# Patient Record
Sex: Female | Born: 1980 | Race: Black or African American | Hispanic: No | Marital: Single | Smoking: Current every day smoker
Health system: Southern US, Community
[De-identification: ages and names within clinical notes are randomized; demographics above are authoritative.]

## PROBLEM LIST (undated history)

## (undated) DIAGNOSIS — N840 Polyp of corpus uteri: Secondary | ICD-10-CM

---

## 2000-04-11 DIAGNOSIS — N840 Polyp of corpus uteri: Secondary | ICD-10-CM

## 2000-04-11 HISTORY — DX: Polyp of corpus uteri: N84.0

## 2000-04-11 HISTORY — PX: PELVIC LAPAROSCOPY: SHX162

## 2002-04-01 ENCOUNTER — Emergency Department (HOSPITAL_COMMUNITY): Admission: EM | Admit: 2002-04-01 | Discharge: 2002-04-01 | Payer: Self-pay

## 2002-07-10 ENCOUNTER — Emergency Department (HOSPITAL_COMMUNITY): Admission: EM | Admit: 2002-07-10 | Discharge: 2002-07-10 | Payer: Self-pay | Admitting: Podiatry

## 2002-11-18 ENCOUNTER — Inpatient Hospital Stay (HOSPITAL_COMMUNITY): Admission: AD | Admit: 2002-11-18 | Discharge: 2002-11-23 | Payer: Self-pay | Admitting: Obstetrics and Gynecology

## 2003-07-03 ENCOUNTER — Emergency Department (HOSPITAL_COMMUNITY): Admission: EM | Admit: 2003-07-03 | Discharge: 2003-07-03 | Payer: Self-pay | Admitting: Emergency Medicine

## 2003-08-07 ENCOUNTER — Emergency Department (HOSPITAL_COMMUNITY): Admission: EM | Admit: 2003-08-07 | Discharge: 2003-08-07 | Payer: Self-pay | Admitting: Emergency Medicine

## 2003-09-18 ENCOUNTER — Ambulatory Visit (HOSPITAL_COMMUNITY): Admission: RE | Admit: 2003-09-18 | Discharge: 2003-09-18 | Payer: Self-pay | Admitting: Obstetrics

## 2003-12-12 ENCOUNTER — Ambulatory Visit (HOSPITAL_COMMUNITY): Admission: RE | Admit: 2003-12-12 | Discharge: 2003-12-12 | Payer: Self-pay | Admitting: Registered Nurse

## 2004-02-09 ENCOUNTER — Inpatient Hospital Stay (HOSPITAL_COMMUNITY): Admission: AD | Admit: 2004-02-09 | Discharge: 2004-02-11 | Payer: Self-pay | Admitting: Obstetrics

## 2004-08-27 ENCOUNTER — Emergency Department (HOSPITAL_COMMUNITY): Admission: EM | Admit: 2004-08-27 | Discharge: 2004-08-27 | Payer: Self-pay | Admitting: Emergency Medicine

## 2005-09-12 ENCOUNTER — Emergency Department (HOSPITAL_COMMUNITY): Admission: EM | Admit: 2005-09-12 | Discharge: 2005-09-12 | Payer: Self-pay | Admitting: Emergency Medicine

## 2005-12-05 ENCOUNTER — Inpatient Hospital Stay (HOSPITAL_COMMUNITY): Admission: AD | Admit: 2005-12-05 | Discharge: 2005-12-05 | Payer: Self-pay | Admitting: Obstetrics

## 2006-01-27 ENCOUNTER — Ambulatory Visit (HOSPITAL_COMMUNITY): Admission: RE | Admit: 2006-01-27 | Discharge: 2006-01-27 | Payer: Self-pay | Admitting: Obstetrics and Gynecology

## 2006-04-11 HISTORY — PX: TUBAL LIGATION: SHX77

## 2006-04-27 ENCOUNTER — Inpatient Hospital Stay (HOSPITAL_COMMUNITY): Admission: AD | Admit: 2006-04-27 | Discharge: 2006-04-27 | Payer: Self-pay | Admitting: Obstetrics

## 2006-06-08 ENCOUNTER — Observation Stay (HOSPITAL_COMMUNITY): Admission: AD | Admit: 2006-06-08 | Discharge: 2006-06-08 | Payer: Self-pay | Admitting: Obstetrics

## 2006-06-10 ENCOUNTER — Inpatient Hospital Stay (HOSPITAL_COMMUNITY): Admission: AD | Admit: 2006-06-10 | Discharge: 2006-06-13 | Payer: Self-pay | Admitting: Obstetrics

## 2011-04-18 ENCOUNTER — Emergency Department (HOSPITAL_COMMUNITY)
Admission: EM | Admit: 2011-04-18 | Discharge: 2011-04-18 | Disposition: A | Discharge status: Home / Self Care | Payer: Self-pay | Attending: Emergency Medicine | Admitting: Emergency Medicine

## 2011-04-18 ENCOUNTER — Encounter: Payer: Self-pay | Admitting: Emergency Medicine

## 2011-04-18 DIAGNOSIS — L02419 Cutaneous abscess of limb, unspecified: Secondary | ICD-10-CM | POA: Insufficient documentation

## 2011-04-18 DIAGNOSIS — L03119 Cellulitis of unspecified part of limb: Secondary | ICD-10-CM | POA: Insufficient documentation

## 2011-04-18 DIAGNOSIS — L0390 Cellulitis, unspecified: Secondary | ICD-10-CM

## 2011-04-18 DIAGNOSIS — F17200 Nicotine dependence, unspecified, uncomplicated: Secondary | ICD-10-CM | POA: Insufficient documentation

## 2011-04-18 DIAGNOSIS — M79609 Pain in unspecified limb: Secondary | ICD-10-CM | POA: Insufficient documentation

## 2011-04-18 MED ORDER — DOXYCYCLINE HYCLATE 100 MG PO CAPS: 100.0000 mg | ORAL_CAPSULE | Freq: Two times a day (BID) | ORAL | Status: AC

## 2011-04-18 MED ORDER — IBUPROFEN 800 MG PO TABS: 800.0000 mg | ORAL_TABLET | Freq: Three times a day (TID) | ORAL | Status: AC

## 2011-04-18 MED ORDER — IBUPROFEN 800 MG PO TABS
800.0000 mg | ORAL_TABLET | Freq: Once | ORAL | Status: AC
  Administered 2011-04-18: 800 mg via ORAL
  Filled 2011-04-18: qty 1

## 2011-04-18 MED ORDER — ACETAMINOPHEN-CODEINE #3 300-30 MG PO TABS: 1.0000 | ORAL_TABLET | Freq: Four times a day (QID) | ORAL | Status: AC | PRN

## 2011-04-18 NOTE — ED Provider Notes (Signed)
History     CSN: [id number]  Arrival date & time 04/18/11  1050   None     Chief Complaint  Patient presents with  . Recurrent Skin Infections    red rasised area in r/groin-thigh     (Consider location/radiation/quality/duration/timing/severity/associated sxs/prior treatment) HPI  Patient presents to ER complaining of "boil" to right upper inner thigh stating she has been having intermittent, recurrent "infections" on inner thighs for the last few weeks stating that she will notice "bumps come up and then go away" but states the current "bump" x 3 days with mild TTP. Denies denies drainage, fevers, chills, pain with walking. Patient states she has no known medical problems and takes no meds on regular basis. Denies aggravating or alleviating factors.   History reviewed. No pertinent past medical history.  Past Surgical History  Procedure Date  . Cesarean section     Family History  Problem Relation Age of Onset  . Hypertension Mother     History  Substance Use Topics  . Smoking status: Current Everyday Smoker  . Smokeless tobacco: Not on file  . Alcohol Use: No    OB History    Grav Para Term Preterm Abortions TAB SAB Ect Mult Living                  Review of Systems  All other systems reviewed and are negative.    Allergies  Review of patient's allergies indicates no known allergies.  Home Medications   Current Outpatient Rx  Name Route Sig Dispense Refill  . ACETAMINOPHEN 500 MG PO TABS Oral Take 1,000 mg by mouth every 6 (six) hours as needed. For headache       BP 112/63  Pulse 80  Temp(Src) 98.3 F (36.8 C) (Oral)  Resp 16  SpO2 100%  LMP 04/11/2011  Physical Exam  Nursing note and vitals reviewed. Constitutional: She is oriented to person, place, and time. She appears well-developed and well-nourished. No distress.  HENT:  Head: Normocephalic and atraumatic.  Eyes: Conjunctivae and EOM are normal. Pupils are equal, round, and reactive to  light.  Neck: Normal range of motion. Neck supple.  Cardiovascular: Normal rate, regular rhythm, normal heart sounds and intact distal pulses.  Exam reveals no gallop and no friction rub.   No murmur heard. Pulmonary/Chest: Effort normal and breath sounds normal. No respiratory distress. She has no wheezes. She has no rales. She exhibits no tenderness.  Abdominal: Bowel sounds are normal. She exhibits no distension and no mass. There is no tenderness. There is no rebound and no guarding.  Musculoskeletal: Normal range of motion. She exhibits no edema and no tenderness.  Neurological: She is alert and oriented to person, place, and time.  Skin: Skin is warm and dry. No rash noted. She is not diaphoretic. No erythema.       Dime size papule of right upper inner thigh with mild TTP and faint erythema but no crepitous or fluctuance.   Psychiatric: She has a normal mood and affect.    ED Course  Procedures (including critical care time)  Labs Reviewed - No data to display No results found.   1. Cellulitis       MDM  Small area of induration without fluctuance that patient declines I&D after discussion of treatment options. I believe abx and warm compresses is appropriate treatment course at this time for small area of cellulitis. Afebrile.         Jenness Corner, PA  04/18/11 1531 

## 2011-04-18 NOTE — ED Provider Notes (Signed)
Medical screening examination/treatment/procedure(s) were performed by non-physician practitioner and as supervising physician I was immediately available for consultation/collaboration.  Orpha Dain, MD 04/18/11 1615 

## 2014-06-15 ENCOUNTER — Emergency Department (HOSPITAL_BASED_OUTPATIENT_CLINIC_OR_DEPARTMENT_OTHER)
Admission: EM | Admit: 2014-06-15 | Discharge: 2014-06-15 | Disposition: A | Discharge status: Home / Self Care | Payer: Self-pay | Attending: Emergency Medicine | Admitting: Emergency Medicine

## 2014-06-15 ENCOUNTER — Encounter (HOSPITAL_BASED_OUTPATIENT_CLINIC_OR_DEPARTMENT_OTHER): Payer: Self-pay | Admitting: *Deleted

## 2014-06-15 DIAGNOSIS — K029 Dental caries, unspecified: Secondary | ICD-10-CM | POA: Insufficient documentation

## 2014-06-15 DIAGNOSIS — Z720 Tobacco use: Secondary | ICD-10-CM | POA: Insufficient documentation

## 2014-06-15 DIAGNOSIS — K088 Other specified disorders of teeth and supporting structures: Secondary | ICD-10-CM | POA: Insufficient documentation

## 2014-06-15 DIAGNOSIS — K0889 Other specified disorders of teeth and supporting structures: Secondary | ICD-10-CM

## 2014-06-15 MED ORDER — HYDROCODONE-ACETAMINOPHEN 5-325 MG PO TABS: 1.0000 | ORAL_TABLET | Freq: Four times a day (QID) | ORAL | Status: DC | PRN

## 2014-06-15 MED ORDER — PENICILLIN V POTASSIUM 500 MG PO TABS: 500.0000 mg | ORAL_TABLET | Freq: Three times a day (TID) | ORAL | Status: DC

## 2014-06-15 NOTE — ED Notes (Signed)
MD at bedside. 

## 2014-06-15 NOTE — ED Notes (Signed)
Pt driving, with 2 children.

## 2014-06-15 NOTE — Discharge Instructions (Signed)
Penicillin as prescribed.  Hydrocodone as prescribed as needed for pain.  Follow-up with dentistry in the next 2-3 days for a recheck.   Dental Pain A tooth ache may be caused by cavities (tooth decay). Cavities expose the nerve of the tooth to air and hot or cold temperatures. It may come from an infection or abscess (also called a boil or furuncle) around your tooth. It is also often caused by dental caries (tooth decay). This causes the pain you are having. DIAGNOSIS  Your caregiver can diagnose this problem by exam. TREATMENT   If caused by an infection, it may be treated with medications which kill germs (antibiotics) and pain medications as prescribed by your caregiver. Take medications as directed.  Only take over-the-counter or prescription medicines for pain, discomfort, or fever as directed by your caregiver.  Whether the tooth ache today is caused by infection or dental disease, you should see your dentist as soon as possible for further care. SEEK MEDICAL CARE IF: The exam and treatment you received today has been provided on an emergency basis only. This is not a substitute for complete medical or dental care. If your problem worsens or new problems (symptoms) appear, and you are unable to meet with your dentist, call or return to this location. SEEK IMMEDIATE MEDICAL CARE IF:   You have a fever.  You develop redness and swelling of your face, jaw, or neck.  You are unable to open your mouth.  You have severe pain uncontrolled by pain medicine. MAKE SURE YOU:   Understand these instructions.  Will watch your condition.  Will get help right away if you are not doing well or get worse. Document Released: 03/28/2005 Document Revised: 06/20/2011 Document Reviewed: 11/14/2007 [facility] Patient Information 2015 [place], [place]. This information is not intended to replace advice given to you by your health care provider. Make sure you discuss any questions you have with your  health care provider.    Emergency Department Resource Guide 1) Find a Doctor and Pay Out of Pocket Although you won't have to find out who is covered by your insurance plan, it is a good idea to ask around and get recommendations. You will then need to call the office and see if the doctor you have chosen will accept you as a new patient and what types of options they offer for patients who are self-pay. Some doctors offer discounts or will set up payment plans for their patients who do not have insurance, but you will need to ask so you aren't surprised when you get to your appointment.  2) Contact Your Local Health Department Not all health departments have doctors that can see patients for sick visits, but many do, so it is worth a call to see if yours does. If you don't know where your local health department is, you can check in your phone book. The CDC also has a tool to help you locate your state's health department, and many state websites also have listings of all of their local health departments.  3) Find a Walk-in Clinic If your illness is not likely to be very severe or complicated, you may want to try a walk in clinic. These are popping up all over the country in pharmacies, drugstores, and shopping centers. They're usually staffed by nurse practitioners or physician assistants that have been trained to treat common illnesses and complaints. They're usually fairly quick and inexpensive. However, if you have serious medical issues or chronic medical problems,  these are probably not your best option.  No Primary Care Doctor: - Call Health Connect at  [phone] - they can help you locate a primary care doctor that  accepts your insurance, provides certain services, etc. - Physician Referral Service- [phone]  Chronic Pain Problems: Organization         Address  Phone   Notes  [facility]  [phone] Patients need to be referred by their primary care  doctor.   Medication Assistance: Organization         Address  Phone   Notes  [facility] Medication [facility] [address] [place], [place] [address] [phone] --Must be a resident of [facility] -- Must have NO insurance coverage whatsoever (no Medicaid/ Medicare, etc.) -- The pt. MUST have a primary care doctor that directs their care regularly and follows them in the community   [facility]  [phone]   [employer]  [phone]    Agencies that provide inexpensive medical care: Organization         Address  Phone   Notes  [facility]  [phone]   [facility]    [phone]   [facility] [address] [place], [place] [address] [phone]   [facility] [address] [place] [place] [phone]   Planned Parenthood    [phone]   [facility]    [phone]   [facility]  [address], [place] Phone:  [phone], Fax:  [phone] Hours of Operation:  9 am - 6 pm, M-F.  Also accepts Medicaid/Medicare and self-pay.  [facility]  [address], [place] Phone: [phone], Fax: [phone]. Hours of Operation:  8:30 am - 5:30 pm, M-F.  Also accepts Medicaid and self-pay.  [facility] [place] [address] [place] Phone: [phone]   [facility] [address] Natasha Bence [place], [place] [phone], Ext. [phone] Mondays & Thursdays: 7-9 AM.  First 15 patients are seen on a first come, first serve basis.    Medicaid-accepting [facility] Providers:  Organization         Address  Phone   Notes  [facility] [address], [place] [phone] Also accepts self-pay patients.  [facility] [address] Laurell Josephs [place], [place]  [phone]   [facility] [address], [place] [phone]   [facility] [address], [place] [phone]   Renaye Rakers [address], [place]   [phone] Only accepts [place] Access [place] patients after they have their name applied to their card.   Self-Pay (no insurance) in [facility]:  Organization         Address  Phone   Notes  Sickle Cell Patients, [facility] Internal Medicine [address] [place], [place] [phone]   [facility] [address] [place], [place] [phone]   [facility]  [address],  [phone]   [facility]/Dr. Osei-Bonsu  [address] [place] or [address], [place] [phone] Phone number for both [place] and [place] locations is the same.  Urgent Medical and  [facility] [address] [place] [phone]   [facility] [address] [place] or [address] [phone]   [facility] [address], [place] [phone], phone; [phone], fax Sees patients 1st and 3rd Saturday of every month.  Must not qualify for public or private insurance (i.e. Medicaid, Medicare, [place] Health Choice, Veterans' Benefits)  Household income should be no more than 200% of the poverty level The clinic cannot treat you if you are pregnant or think you are pregnant  Sexually transmitted diseases are not treated at the clinic.    Dental Care: Organization         Address  Phone  Notes  [facility] Department of [facility] [address] [place], [place] [phone] Accepts children up to age 79 who are enrolled in [place] or [place] Health Choice; pregnant women with a Medicaid card; and children who have applied for Medicaid or [place] Health Choice, but were declined, whose parents can pay a reduced fee at time of  service.  [facility] Department of [facility]  [address], [place] [phone] Accepts children up to age 58 who are enrolled in [place] or [place] Health Choice; pregnant women with a Medicaid card; and children who have applied for Medicaid or [place] Health Choice, but were declined, whose parents can pay a reduced fee at time of service.  [facility]  [address] [place], [place] [phone] Patients are seen by appointment only. Walk-ins are not accepted. [facility] will see patients 15 years of age and older. Monday - Tuesday (8am-5pm) Most Wednesdays (8:30-5pm) $30 per visit, cash only  [facility] Adult Dental Access PRO[facility]  [address], [facility] [phone] Patients are seen by appointment only. Walk-ins are not accepted. [facility] will see patients 48 years of age and older. One Wednesday Evening (Monthly: Volunteer Based).  $30 per visit, cash only  [employer]  [phone] for adults; Children under age 61, call Graduate Pediatric Dentistry at [phone]. Children aged 21-14, please call [phone] to request a pediatric application.  Dental services are provided in all areas of dental care including fillings, crowns and bridges, complete and partial dentures, implants, gum treatment, root canals, and extractions. Preventive care is also provided. Treatment is provided to both adults and children. Patients are selected via a lottery and there is often a waiting list.   [facility] [address] [place]  [phone] [email or web address]   [facility] [address] [place], [place] [phone], Ext. [phone] Second and Fourth Thursday of each month, opens at 6:30 AM; Clinic ends at 9 AM.  Patients are seen on a first-come first-served basis, and a limited number are seen during each clinic.   [facility]  [address] Ether Griffins [place],  [place] [phone]   Eligibility Requirements You must have lived in [place], [place], or [place] counties for at least the last three months.   You cannot be eligible for state or federal sponsored [place], including [employer], [place], or [employer].   You generally cannot be eligible for healthcare insurance through your employer.    How to apply: Eligibility screenings are held every Tuesday and Wednesday afternoon from 1:00 pm until 4:00 pm. You do not need an appointment for the interview!  [facility]  Dental Clinic [address], [place], [place] [phone]   [facility] Health Department  [phone]   [facility] Health Department  [phone]   [facility] Health Department  [phone]    Behavioral Health Resources in the Community: Intensive Outpatient Programs Organization         Address  Phone  Notes  [facility] Services [address] [place], [place] [phone]   [facility] Outpatient [address], [place], [place] [phone]   [facility] [address] [place], [place]  [phone]   [facility] Mental Health [address],  [place], [place] [phone] or [phone]   Substance Abuse Resources Organization         Address  Phone  Notes  [facility] Drug Services  [phone]   [facility]  [phone]   [facility] [place]  [phone]   Floydene Flock  [phone]   Residential & Outpatient Substance Abuse Program  [phone]   Psychological Services Organization         Address  Phone  Notes  [facility] Health  [phone]   [facility] Services  [phone]   [facility] Mental Health [address], [place] [phone] or [phone]    Mobile Crisis Teams Organization         Address  Phone  Notes  [facility], Mobile Crisis Care Unit  [phone]   [facility]  [address] [place], [place] [phone]   Doristine Locks [address] [place] [phone]    Self-Help/Support Groups Organization         Address  Phone             Notes  [facility] [place] - variety of support groups  336- I7437963 Call for more information  Narcotics Anonymous (NA), [facility] [address], [employer] [place]  2 meetings at this location   Statistician         Address  Phone  Notes  [facility] [address],    [place]  [phone]   [facility]  [address] [place] [address], [place], [place] [phone]   [facility] [address] [place], [place] [phone] Admissions: 8am-3pm M-F  [facility] [address],    [place], [place] [phone]   [facility] [address] [place], [place], [place] [phone]   [facility] [address]  [place], [place] [phone]   [facility] - Intensive Outpatient [address] Laurell Josephs [address], [place], [place] [phone]   [facility] ([facility]iction [facility] [address] [place]  [place], [place] [phone] or 5742847411   [facility] ([facility]) [address], [place], [place] [phone] Accepts Medicaid  [facility] [address]  [place] [phone] Substance Abuse/Addiction Treatment   [facility] Organization         Address  Phone  Notes  [facility]  [phone]   Angie Fava, PhD [address] Ervin Knack [place], [place]   [phone] or [phone]   [facility] Behavioral   [address] [place], [place] [phone]   [facility] [address] [place], [place] [phone] Insurance/Medicaid/sponsorship through [employer] and Families [address]  [place], [place] [phone]  [place], [place] [phone]    Dr. Adele Schilder  [phone]   [facility] Dept. 1) [address], [place] 2) [place] 3)  [place] [address], [place] [phone]  [phone]   [place] [phone] or [phone] (After Hours)

## 2014-06-15 NOTE — ED Notes (Signed)
Pt reports 3 days of pain and swelling on right side of face, unsure if from teeth on right upper or lower.  No obvious cavities or chippings noted.  Mild swelling to right side of face with tenderness to jaw, upper aspect of neck back to lobe of ear and sinus area.  Denies fever.

## 2014-06-15 NOTE — ED Notes (Signed)
Patient states she is having right side dental and facial pain and swelling since friday

## 2014-06-16 NOTE — ED Provider Notes (Signed)
CSN: [id number]     Arrival date & time 06/15/14  1440 History   First MD Initiated Contact with Patient 06/15/14 1508     Chief Complaint  Patient presents with  . Dental Pain     (Consider location/radiation/quality/duration/timing/severity/associated sxs/prior Treatment) HPI Comments: Patient presents with pain and swelling to the right jaw and side of face. She denies any fevers or chills. She denies any difficulty swallowing.  Patient is a 34 y.o. female presenting with tooth pain. The history is provided by the patient.  Dental Pain Location:  Lower Lower teeth location:  30/RL 1st molar and 31/RL 2nd molar Quality:  Throbbing Severity:  Moderate Onset quality:  Gradual Duration:  2 days Timing:  Constant Progression:  Worsening Chronicity:  New Relieved by:  Nothing Worsened by:  Nothing tried   History reviewed. No pertinent past medical history. Past Surgical History  Procedure Laterality Date  . Cesarean section     Family History  Problem Relation Age of Onset  . Hypertension Mother    History  Substance Use Topics  . Smoking status: Current Every Day Smoker  . Smokeless tobacco: Not on file  . Alcohol Use: Yes     Comment: occas   OB History    No data available     Review of Systems  All other systems reviewed and are negative.     Allergies  Review of patient's allergies indicates no known allergies.  Home Medications   Prior to Admission medications   Medication Sig Start Date End Date Taking? Authorizing Provider  acetaminophen (TYLENOL) 500 MG tablet Take 1,000 mg by mouth every 6 (six) hours as needed. For headache     Historical Provider, MD  HYDROcodone-acetaminophen (NORCO) 5-325 MG per tablet Take 1-2 tablets by mouth every 6 (six) hours as needed. 06/15/14   Geoffery Lyonsouglas Minela [place], MD  penicillin v potassium (VEETID) 500 MG tablet Take 1 tablet (500 mg total) by mouth 3 (three) times daily. 06/15/14   Geoffery Lyonsouglas Marjon Doxtater, MD   BP 136/86 mmHg  Pulse 85   Temp(Src) 98.7 F (37.1 C) (Oral)  Resp 16  Ht 4\' 11"  (1.499 m)  Wt 134 lb (60.782 kg)  BMI 27.05 kg/m2  SpO2 100% Physical Exam  Constitutional: She is oriented to person, place, and time. She appears well-developed and well-nourished. No distress.  HENT:  Head: Normocephalic and atraumatic.  There is swelling and tenderness to palpation to the right lower jaw. There are several teeth with multiple caries, however no swelling that would be indicative of an abscess. There is some gingival inflammation surrounding the lower rear molars.  Neck: Normal range of motion. Neck supple.  Neurological: She is alert and oriented to person, place, and time.  Skin: Skin is warm and dry. She is not diaphoretic.  Nursing note and vitals reviewed.   ED Course  Procedures (including critical care time) Labs Review Labs Reviewed - No data to display  Imaging Review No results found.   EKG Interpretation None      MDM   Final diagnoses:  Pain, dental    We'll treat with antibiotics, pain medication, and follow-up with dentistry.    Geoffery Lyonsouglas Karmela Bram, MD 06/16/14 [phone]

## 2014-09-28 ENCOUNTER — Emergency Department (HOSPITAL_BASED_OUTPATIENT_CLINIC_OR_DEPARTMENT_OTHER)
Admission: EM | Admit: 2014-09-28 | Discharge: 2014-09-28 | Disposition: A | Discharge status: Home / Self Care | Payer: Self-pay | Attending: Emergency Medicine | Admitting: Emergency Medicine

## 2014-09-28 ENCOUNTER — Encounter (HOSPITAL_BASED_OUTPATIENT_CLINIC_OR_DEPARTMENT_OTHER): Payer: Self-pay | Admitting: *Deleted

## 2014-09-28 DIAGNOSIS — S025XXB Fracture of tooth (traumatic), initial encounter for open fracture: Secondary | ICD-10-CM | POA: Insufficient documentation

## 2014-09-28 DIAGNOSIS — Z720 Tobacco use: Secondary | ICD-10-CM | POA: Insufficient documentation

## 2014-09-28 DIAGNOSIS — Y998 Other external cause status: Secondary | ICD-10-CM | POA: Insufficient documentation

## 2014-09-28 DIAGNOSIS — Y9289 Other specified places as the place of occurrence of the external cause: Secondary | ICD-10-CM | POA: Insufficient documentation

## 2014-09-28 DIAGNOSIS — Y9389 Activity, other specified: Secondary | ICD-10-CM | POA: Insufficient documentation

## 2014-09-28 DIAGNOSIS — X58XXXA Exposure to other specified factors, initial encounter: Secondary | ICD-10-CM | POA: Insufficient documentation

## 2014-09-28 MED ORDER — PENICILLIN V POTASSIUM 500 MG PO TABS: 500.0000 mg | ORAL_TABLET | Freq: Three times a day (TID) | ORAL | Status: DC

## 2014-09-28 MED ORDER — OXYCODONE-ACETAMINOPHEN 5-325 MG PO TABS
2.0000 | ORAL_TABLET | Freq: Once | ORAL | Status: AC
  Administered 2014-09-28: 2 via ORAL
  Filled 2014-09-28: qty 2

## 2014-09-28 MED ORDER — PENICILLIN V POTASSIUM 250 MG PO TABS
500.0000 mg | ORAL_TABLET | Freq: Once | ORAL | Status: AC
  Administered 2014-09-28: 500 mg via ORAL
  Filled 2014-09-28: qty 2

## 2014-09-28 MED ORDER — OXYCODONE-ACETAMINOPHEN 5-325 MG PO TABS: 1.0000 | ORAL_TABLET | ORAL | Status: DC | PRN

## 2014-09-28 NOTE — ED Notes (Signed)
Patient c/o dental pain due to cracking a tooth on Friday on her L lower jaw

## 2014-09-28 NOTE — ED Provider Notes (Signed)
CSN: [id number]     Arrival date & time 09/28/14  0740 History   First MD Initiated Contact with Patient 09/28/14 0802     Chief Complaint  Patient presents with  . Dental Pain     (Consider location/radiation/quality/duration/timing/severity/associated sxs/prior Treatment) Patient is a 34 y.o. female presenting with tooth pain. The history is provided by the patient.  Dental Pain Location:  Lower Lower teeth location:  18/LL 2nd molar Quality:  Aching, sharp and constant Severity:  Moderate Onset quality:  Sudden Timing:  Constant Progression:  Unchanged Context: dental fracture   Previous work-up:  Root canal Relieved by:  Acetaminophen Worsened by:  Nothing tried Ineffective treatments:  None tried Associated symptoms: no congestion, no difficulty swallowing, no drooling, no facial pain, no facial swelling, no fever, no gum swelling, no headaches, no neck pain, no neck swelling, no oral bleeding, no oral lesions and no trismus   Risk factors: lack of dental care   Risk factors: no alcohol problem, no cancer, no chewing tobacco use, no diabetes and no immunosuppression     History reviewed. No pertinent past medical history. Past Surgical History  Procedure Laterality Date  . Cesarean section     Family History  Problem Relation Age of Onset  . Hypertension Mother    History  Substance Use Topics  . Smoking status: Current Every Day Smoker  . Smokeless tobacco: Not on file  . Alcohol Use: Yes     Comment: occas   OB History    No data available     Review of Systems  Constitutional: Negative for fever.  HENT: Negative for congestion, drooling, facial swelling and mouth sores.   Musculoskeletal: Negative for neck pain.  Neurological: Negative for headaches.  All other systems reviewed and are negative.     Allergies  Review of patient's allergies indicates no known allergies.  Home Medications   Prior to Admission medications   Medication Sig Start  Date End Date Taking? Authorizing Provider  acetaminophen (TYLENOL) 500 MG tablet Take 1,000 mg by mouth every 6 (six) hours as needed. For headache     Historical Provider, MD  HYDROcodone-acetaminophen (NORCO) 5-325 MG per tablet Take 1-2 tablets by mouth every 6 (six) hours as needed. 06/15/14   Geoffery Lyons, MD  oxyCODONE-acetaminophen (PERCOCET/ROXICET) 5-325 MG per tablet Take 1 tablet by mouth every 4 (four) hours as needed. 09/28/14   Margarita Grizzle, MD  penicillin v potassium (VEETID) 500 MG tablet Take 1 tablet (500 mg total) by mouth 3 (three) times daily. 09/28/14   Margarita Grizzle, MD   BP 119/59 mmHg  Pulse 85  Temp(Src) 98.5 F (36.9 C) (Oral)  Resp 18  Ht 4\' 11"  (1.499 m)  Wt 125 lb (56.7 kg)  BMI 25.23 kg/m2  SpO2 100% Physical Exam  Constitutional: She appears well-developed and well-nourished.  HENT:  Head: Normocephalic and atraumatic.  Mouth/Throat: Uvula is midline and oropharynx is clear and moist. No posterior oropharyngeal edema or posterior oropharyngeal erythema.    fracture  Eyes: Pupils are equal, round, and reactive to light.  Neck: Normal range of motion. Neck supple.  Cardiovascular: Normal rate.   Pulmonary/Chest: Effort normal.  Abdominal: Soft.  Musculoskeletal: Normal range of motion.  Neurological: She is alert.  Skin: Skin is warm and dry.  Psychiatric: She has a normal mood and affect.  Nursing note and vitals reviewed.   ED Course  Procedures (including critical care time) Labs Review Labs Reviewed - No data to display  Imaging Review No results found.   EKG Interpretation None      MDM   Final diagnoses:  Tooth fracture, open, initial encounter        Margarita Grizzle, MD 09/28/14 [phone]

## 2014-09-28 NOTE — Discharge Instructions (Signed)
Dental Fracture °You have a dental fracture or injury. This can mean the tooth is loose, has a chip in the enamel or is broken. If just the outer enamel is chipped, there is a good chance the tooth will not become infected. The only treatment needed may be to smooth off a rough edge. Fractures into the deeper layers (dentin and pulp) cause greater pain and are more likely to become infected. These require you to see a dentist as soon as possible to save the tooth. °Loose teeth may need to be wired or bonded with a plastic splint to hold them in place. A paste may be painted on the open area of the broken tooth to reduce the pain. Antibiotics and pain medicine may be prescribed. Choosing a soft or liquid diet and rinsing the mouth out with warm water after meals may be helpful. °See your dentist as recommended. Failure to seek care or follow up with a dentist or other specialist as recommended could result in the loss of your tooth, infection, or permanent dental problems. °SEEK MEDICAL CARE IF:  °· You have increased pain not controlled with medicines. °· You have swelling around the tooth, in the face or neck. °· You have bleeding which starts, continues, or gets worse. °· You have a fever. °Document Released: 05/05/2004 Document Revised: 06/20/2011 Document Reviewed: 02/17/2009 °ExitCare® Patient Information ©2015 [employer]. This information is not intended to replace advice given to you by your health care provider. Make sure you discuss any questions you have with your health care provider. ° ° ° °Emergency Department Resource Guide °1) Find a Doctor and Pay Out of Pocket °Although you won't have to find out who is covered by your insurance plan, it is a good idea to ask around and get recommendations. You will then need to call the office and see if the doctor you have chosen will accept you as a new patient and what types of options they offer for patients who are self-pay. Some doctors offer discounts or  will set up payment plans for their patients who do not have insurance, but you will need to ask so you aren't surprised when you get to your appointment. ° °2) Contact Your Local Health Department °Not all health departments have doctors that can see patients for sick visits, but many do, so it is worth a call to see if yours does. If you don't know where your local health department is, you can check in your phone book. The CDC also has a tool to help you locate your state's health department, and many state websites also have listings of all of their local health departments. ° °3) Find a Walk-in Clinic °If your illness is not likely to be very severe or complicated, you may want to try a walk in clinic. These are popping up all over the country in pharmacies, drugstores, and shopping centers. They're usually staffed by nurse practitioners or physician assistants that have been trained to treat common illnesses and complaints. They're usually fairly quick and inexpensive. However, if you have serious medical issues or chronic medical problems, these are probably not your best option. ° °No Primary Care Doctor: °- Call Health Connect at  832-8000 - they can help you locate a primary care doctor that  accepts your insurance, provides certain services, etc. °- Physician Referral Service- 1-800-533-3463 ° °Chronic Pain Problems: °Organization         Address  Phone   Notes  °[facility]  ([phone]) [phone] Patients need to be referred by their primary care doctor.  ° °Medication Assistance: °Organization         Address  Phone   Notes  °[place] [facility] [address] °[place], [place] [address] (336) 641-8030 --Must be a resident of [place] °-- Must have NO insurance coverage whatsoever (no Medicaid/ Medicare, etc.) °-- The pt. MUST have a primary care doctor that directs their care regularly and follows them in the community °  °[facility]  (866)  331-1348   °[employer] Way  (888) 892-1162   ° °Agencies that provide inexpensive medical care: °Organization         Address  Phone   Notes  °[facility]  (336) 832-8035   °[facility]    (336) 832-7272   °[facility] [address] °[place], [place] [address] (336) 832-4777   °[facility] [address], °[place] (336) 271-4999   °[facility]    (336) 373-0678   °[facility]    (336) 272-1050   °[facility] ° [address], [place] Phone:  (336) 832-4444, Fax:  (336) 832-4440 Hours of Operation:  9 am - 6 pm, M-F.  Also accepts Medicaid/Medicare and self-pay.  °[facility] ° [address], [place] Phone: (336) 832-3150, Fax: (336) [phone]. Hours of Operation:  8:30 am - 5:30 pm, M-F.  Also accepts Medicaid and self-pay.  °[facility] [place] [address], [place] Phone: (336) 878-6027   °[facility] [address], [place], [place] (336)723-1848, Ext. 123 Mondays & Thursdays: 7-9 AM.  First 15 patients are seen on a first come, first serve basis. °  ° °Medicaid-accepting [place] Providers: ° °Organization         Address  Phone   Notes  °[facility] [address], [place] (336) 641-2100 Also accepts self-pay patients.  °[facility] [address], [place] ° (336) 856-9996   °[facility] [address], [place] (336) 288-8857   °[facility] [address] [place] [address], [place] (336) 299-7000   °Veita Bland [address], [place]  ° (336) 373-1557 Only accepts [place] Access Medicaid patients after they have their name applied to their card.  ° °Self-Pay (no insurance) in [place]: ° °Organization         Address  Phone   Notes  °Sickle Cell Patients, [facility] [address], [place] (336) 832-1970   °[facility] [address], [place] (336) 832-4400   °[facility] ° [address], [place] (336) 992-4800   °[facility]/Dr. Osei-Bonsu ° [address] [place] [address], [place] or [address], [place] (336) [phone] Phone number for both [place] and [place] locations is the same.  °[facility] [address], [place] (336) [phone]   °[facility] [address] [place] [address],  or [address] (336) 852-7530 °(336) 878-2260   °[facility] [address],  (336) 350-1642, phone; (336) 294-5005, fax Sees patients 1st and 3rd Saturday of every month.  Must not qualify for public or private insurance (i.e. Medicaid, Medicare,  Health   Choice, Veterans' Benefits)  Household income should be no more than 200% of the poverty level The clinic cannot treat you if you are pregnant or think you are pregnant  Sexually transmitted diseases are not treated at the clinic.    Dental Care: Organization         Address  Phone  Notes  [facility] Department of [facility] [address] [place], [place] [phone] Accepts children up to age 70 who are enrolled in [place] or [place] Health Choice; pregnant women with a Medicaid card; and children who have applied for Medicaid or [place] Health Choice, but were declined, whose parents can pay a reduced fee at time of service.  [facility] Department of [facility]  [address], [place] [phone] Accepts children up to age 71 who are enrolled in [place] or [place] Health Choice; pregnant women with a Medicaid card; and children who have applied for Medicaid or [place] Health Choice, but were declined, whose parents can pay a reduced fee at time of service.  [facility]  [address] [place], [place] (847) [phone] Patients are seen by appointment only. Walk-ins are not accepted. [facility] will see patients 46 years of age and older. Monday - Tuesday (8am-5pm) Most Wednesdays (8:30-5pm) $30 per visit, cash only  [facility]  [address], [facility] ([phone])[phone] Patients are seen by appointment only. Walk-ins are not accepted. [facility] will see patients 70 years of age and older. One Wednesday Evening (Monthly: Volunteer Based).  $30 per visit, cash only  [employer]  760-663-8688 for adults; Children under age 41, call Graduate Pediatric Dentistry at [phone]. Children aged 65-14, please call [phone])[phone] to request a pediatric application.  Dental services are provided in all areas of dental care including fillings, crowns and bridges, complete and partial dentures, implants, gum treatment, root canals, and extractions. Preventive care is also provided. Treatment is provided to both adults and children. Patients are selected via a lottery and there is often a waiting list.   [facility] [address] [place]  804-815-6834 [email or web address]   [facility] [address] [place], [place] [phone], Ext. 123 Second and Fourth Thursday of each month, opens at 6:30 AM; Clinic ends at 9 AM.  Patients are seen on a first-come first-served basis, and a limited number are seen during each clinic.   [facility]  [address] [place], [place] [phone]) [phone]   Eligibility Requirements You must have lived in [place], [place], or [place] counties for at least the last three months.   You cannot be eligible for state or federal sponsored [place], including [employer], [place], or [employer].   You generally cannot be eligible for healthcare insurance through your employer.    How to apply: Eligibility screenings are held every Tuesday and Wednesday afternoon  from 1:00 pm until 4:00 pm. You do not need an appointment for the interview!  [facility] [address], [place], [place] [phone]   [facility] Health Department  [phone]   [facility]  (579)437-8455   [facility] Health Department  848-259-5302    Behavioral Health Resources in the Community: Intensive Outpatient Programs Organization         Address  Phone  Notes  [facility] [address] [place], [place] [phone]   [facility] [address] [place], [place] 253-664-4034   [facility] [address] [place], [place]  742-595-6387   [facility] [address],  [place], [place] 5-643-329-5188 or (289)128-9199   Substance Abuse Resources Organization         Address  Phone  Notes  [facility]  (602) 736-6274   [facility]  (440)168-3033   [facility] [place]  5126712943   Floydene Flock  (947)486-7904   Residential & Outpatient Substance Abuse Program  410-127-2359   Psychological Services Organization         Address  Phone  Notes  [facility]  336305-173-1937   [facility]  (272)735-7671   [facility] [address] [place] (854) 429-7000 or 617-717-8984    Mobile Crisis Teams Organization         Address  Phone  Notes  [facility], Mobile Crisis Care Unit  320-049-5804   [facility]  [address] [place], [place] 431-540-0867   Doristine Locks [address] [place] 619-509-3267    Self-Help/Support Groups Organization         Address  Phone             Notes  [facility] [place] - variety of support groups  336- I7437963 Call for more information  Narcotics Anonymous (NA), [facility] [address], [employer] [place]  2 meetings at this location   Nutritional therapist         Address  Phone  Notes  [facility] [address],    [place]  [phone]   [facility]  [address] [place] [address], [place], [place] [phone]   [facility] [address] [place], [place] [phone] Admissions: 8am-3pm M-F  [facility] [address]    [place], [place] [phone]   [facility] [address] [place], [place], [place] [phone]   [facility] [address]  [place], [place] [phone]   [facility] Intensive Outpatient [address], [place], [place] [phone]   [facility] [address] [place].,  [place], [place] 1-497-026-3785 or 820-010-7858   [facility] ([facility] [address] [place], [place] 878-676-7209 Accepts Medicaid  [facility] [address]  [place] 4-709-628-3662 Substance Abuse/Addiction Treatment   [facility] Organization         Address  Phone  Notes  [facility]  878-111-8872   Angie Fava, PhD [address] [place], [place]   303-359-9524 or 667-736-9309   [facility]   [address] [place], [place] 315-214-2916   [facility] [address] [place], [place] 289-031-3372 Insurance/Medicaid/sponsorship through [facility] [address]                                    [place], [place] [phone])[phone] Therapy/tele-psych/case  [facility] [address] [place] [address], [place] ([phone]    Dr. Lolly Mustache  [phone]   [facility]  [employer] [facility] Dept. 1) [address], [address]  2) [place] 3)  [place], [place] [phone]  [phone]   [facility] Child Abuse Hotline 210-597-2012 or (901)817-2443 (After Hours)

## 2015-12-13 ENCOUNTER — Encounter (HOSPITAL_BASED_OUTPATIENT_CLINIC_OR_DEPARTMENT_OTHER): Payer: Self-pay | Admitting: *Deleted

## 2015-12-13 ENCOUNTER — Emergency Department (HOSPITAL_BASED_OUTPATIENT_CLINIC_OR_DEPARTMENT_OTHER)
Admission: EM | Admit: 2015-12-13 | Discharge: 2015-12-13 | Disposition: A | Discharge status: Home / Self Care | Payer: Self-pay | Attending: Emergency Medicine | Admitting: Emergency Medicine

## 2015-12-13 DIAGNOSIS — F17200 Nicotine dependence, unspecified, uncomplicated: Secondary | ICD-10-CM | POA: Insufficient documentation

## 2015-12-13 DIAGNOSIS — K029 Dental caries, unspecified: Secondary | ICD-10-CM | POA: Insufficient documentation

## 2015-12-13 DIAGNOSIS — Z79899 Other long term (current) drug therapy: Secondary | ICD-10-CM | POA: Insufficient documentation

## 2015-12-13 DIAGNOSIS — K0381 Cracked tooth: Secondary | ICD-10-CM | POA: Insufficient documentation

## 2015-12-13 HISTORY — DX: Polyp of corpus uteri: N84.0

## 2015-12-13 MED ORDER — TRAMADOL HCL 50 MG PO TABS: 50.0000 mg | ORAL_TABLET | Freq: Four times a day (QID) | ORAL | 0 refills | Status: DC | PRN

## 2015-12-13 MED ORDER — PENICILLIN V POTASSIUM 500 MG PO TABS: 500.0000 mg | ORAL_TABLET | Freq: Three times a day (TID) | ORAL | 0 refills | Status: DC

## 2015-12-13 MED ORDER — IBUPROFEN 600 MG PO TABS: 600.0000 mg | ORAL_TABLET | Freq: Four times a day (QID) | ORAL | 0 refills | Status: DC | PRN

## 2015-12-13 NOTE — Discharge Instructions (Signed)
Please read and follow all provided instructions.  Your diagnoses today include:  1. Pain due to dental caries     The exam and treatment you received today has been provided on an emergency basis only. This is not a substitute for complete medical or dental care.  Tests performed today include:  Vital signs. See below for your results today.   Medications prescribed:   Tramadol - narcotic-like pain medication  DO NOT drive or perform any activities that require you to be awake and alert because this medicine can make you drowsy.    Ibuprofen (Motrin, Advil) - anti-inflammatory pain medication  Do not exceed 600mg  ibuprofen every 6 hours, take with food  You have been prescribed an anti-inflammatory medication or NSAID. Take with food. Take smallest effective dose for the shortest duration needed for your pain. Stop taking if you experience stomach pain or vomiting.    Penicillin - antibiotic  You have been prescribed an antibiotic medicine: take the entire course of medicine even if you are feeling better. Stopping early can cause the antibiotic not to work.  Take any prescribed medications only as directed.  Home care instructions:  Follow any educational materials contained in this packet.  Follow-up instructions: Please follow-up with your dentist for further evaluation of your symptoms.   Dental Assistance: See below for dental referrals  Return instructions:   Please return to the Emergency Department if you experience worsening symptoms.  Please return if you develop a fever, you develop more swelling in your face or neck, you have trouble breathing or swallowing food.  Please return if you have any other emergent concerns.  Additional Information:  Your vital signs today were: BP 124/74 (BP Location: Right Arm)    Pulse 80    Temp 98.1 F (36.7 C) (Oral)    Resp 18    Ht 4\' 11"  (1.499 m)    Wt 61.7 kg    LMP 11/28/2015 (Exact Date)    SpO2 100%    BMI 27.47  kg/m  If your blood pressure (BP) was elevated above 135/85 this visit, please have this repeated by your doctor within one month. --------------

## 2015-12-13 NOTE — ED Triage Notes (Signed)
Pt reports a tooth breaking 2 days ago while eating (R upper molar). Denies fever, drainage from site, n/v/d. Reports taking 2 tabs Motrin around 0300 with relief.

## 2015-12-13 NOTE — ED Provider Notes (Signed)
[facility] Provider [facility]   CSN: [id number] Arrival date & time: 12/13/15  0908     History   Chief Complaint Chief Complaint  Patient presents with  . Dental Pain    HPI Olivia Mcneil is a 35 y.o. female.  Patient presents with complaint of right upper dental pain starting last night after she was eating chicken. Patient broke a tooth when she was chewing. No facial swelling or neck swelling. No difficulty breathing or swallowing. Patient has taken ibuprofen with some relief. She describes the pain as throbbing. She has had dental problems before. No fever, nausea, vomiting or diarrhea. The onset of this condition was acute. The course is constant. Aggravating factors: none. Alleviating factors: none.      Past Medical History:  Diagnosis Date  . Uterine polyp 2002    There are no active problems to display for this patient.   Past Surgical History:  Procedure Laterality Date  . CESAREAN SECTION    . PELVIC LAPAROSCOPY  2002   for uterine polyps  . TUBAL LIGATION  2008    OB History    No data available       Home Medications    Prior to Admission medications   Medication Sig Start Date End Date Taking? Authorizing Provider  acetaminophen (TYLENOL) 500 MG tablet Take 1,000 mg by mouth every 6 (six) hours as needed. For headache    Yes Historical Provider, MD  ibuprofen (ADVIL,MOTRIN) 600 MG tablet Take 1 tablet (600 mg total) by mouth every 6 (six) hours as needed. 12/13/15   Renne Crigler, PA-C  penicillin v potassium (VEETID) 500 MG tablet Take 1 tablet (500 mg total) by mouth 3 (three) times daily. 12/13/15   Renne Crigler, PA-C  traMADol (ULTRAM) 50 MG tablet Take 1 tablet (50 mg total) by mouth every 6 (six) hours as needed. 12/13/15   Renne Crigler, PA-C    Family History Family History  Problem Relation Age of Onset  . Hypertension Mother     Social History Social History  Substance Use Topics  . Smoking status: Current Every Day Smoker    . Smokeless tobacco: Never Used  . Alcohol use Yes     Comment: monthly     Allergies   Review of patient's allergies indicates no known allergies.   Review of Systems Review of Systems  Constitutional: Negative for fever.  HENT: Positive for dental problem. Negative for ear pain, facial swelling, sore throat and trouble swallowing.   Respiratory: Negative for shortness of breath and stridor.   Musculoskeletal: Negative for neck pain.  Skin: Negative for color change.  Neurological: Negative for headaches.   Physical Exam Updated Vital Signs BP 122/82 (BP Location: Right Arm)   Pulse 72   Temp 98.1 F (36.7 C) (Oral)   Resp 18   Ht 4\' 11"  (1.499 m)   Wt 61.7 kg   LMP 11/28/2015 (Exact Date)   SpO2 100%   BMI 27.47 kg/m   Physical Exam  Constitutional: She appears well-developed and well-nourished.  HENT:  Head: Normocephalic and atraumatic.  Right Ear: Tympanic membrane, external ear and ear canal normal.  Left Ear: Tympanic membrane, external ear and ear canal normal.  Nose: Nose normal.  Mouth/Throat: Uvula is midline, oropharynx is clear and moist and mucous membranes are normal. No trismus in the jaw. Abnormal dentition. Dental caries present. No dental abscesses or uvula swelling. No tonsillar abscesses.  Patient with fractured tooth #3. Patient with tenderness  of the gumline but no palpable or visual abscess or dental infection.  Eyes: Conjunctivae are normal.  Neck: Normal range of motion. Neck supple.  No neck swelling or Ludwig's angina  Lymphadenopathy:    She has no cervical adenopathy.  Neurological: She is alert.  Skin: Skin is warm and dry.  Psychiatric: She has a normal mood and affect.  Nursing note and vitals reviewed.    ED Treatments / Results   Procedures Procedures (including critical care time)   Initial Impression / Assessment and Plan / ED Course  I have reviewed the triage vital signs and the nursing notes.  Pertinent labs &  imaging results that were available during my care of the patient were reviewed by me and considered in my medical decision making (see chart for details).  Clinical Course   10:16 AM Patient seen and examined.  Vital signs reviewed and are as follows: BP 122/82 (BP Location: Right Arm)   Pulse 72   Temp 98.1 F (36.7 C) (Oral)   Resp 18   Ht 4\' 11"  (1.499 m)   Wt 61.7 kg   LMP 11/28/2015 (Exact Date)   SpO2 100%   BMI 27.47 kg/m   Patient counseled on use of narcotic pain medications. Counseled not to combine these medications with others containing tylenol. Urged not to drink alcohol, drive, or perform any other activities that requires focus while taking these medications. The patient verbalizes understanding and agrees with the plan.  Patient counseled to fill the penicillin only she develops any objective facial swelling or fever.  Patient counseled to take prescribed medications as directed, return with worsening facial or neck swelling, and to follow-up with their dentist as soon as possible.    Final Clinical Impressions(s) / ED Diagnoses   Final diagnoses:  Pain due to dental caries    New Prescriptions New Prescriptions   IBUPROFEN (ADVIL,MOTRIN) 600 MG TABLET    Take 1 tablet (600 mg total) by mouth every 6 (six) hours as needed.   PENICILLIN V POTASSIUM (VEETID) 500 MG TABLET    Take 1 tablet (500 mg total) by mouth 3 (three) times daily.   TRAMADOL (ULTRAM) 50 MG TABLET    Take 1 tablet (50 mg total) by mouth every 6 (six) hours as needed.     Renne CriglerJoshua Arkie Tagliaferro, PA-C 12/13/15 1018    Eber HongBrian Miller, MD 12/14/15 [phone]

## 2016-07-22 ENCOUNTER — Encounter (HOSPITAL_BASED_OUTPATIENT_CLINIC_OR_DEPARTMENT_OTHER): Payer: Self-pay | Admitting: *Deleted

## 2016-07-22 ENCOUNTER — Emergency Department (HOSPITAL_BASED_OUTPATIENT_CLINIC_OR_DEPARTMENT_OTHER)
Admission: EM | Admit: 2016-07-22 | Discharge: 2016-07-22 | Disposition: A | Discharge status: Home / Self Care | Payer: Self-pay | Attending: Emergency Medicine | Admitting: Emergency Medicine

## 2016-07-22 DIAGNOSIS — K0889 Other specified disorders of teeth and supporting structures: Secondary | ICD-10-CM

## 2016-07-22 DIAGNOSIS — K029 Dental caries, unspecified: Secondary | ICD-10-CM | POA: Insufficient documentation

## 2016-07-22 MED ORDER — PENICILLIN V POTASSIUM 500 MG PO TABS: 500.0000 mg | ORAL_TABLET | Freq: Three times a day (TID) | ORAL | 0 refills | Status: DC

## 2016-07-22 MED ORDER — PENICILLIN V POTASSIUM 250 MG PO TABS
500.0000 mg | ORAL_TABLET | Freq: Once | ORAL | Status: AC
  Administered 2016-07-22: 500 mg via ORAL
  Filled 2016-07-22: qty 2

## 2016-07-22 MED ORDER — BUPIVACAINE-EPINEPHRINE (PF) 0.5% -1:200000 IJ SOLN
1.8000 mL | Freq: Once | INTRAMUSCULAR | Status: AC
  Administered 2016-07-22: 1.8 mL
  Filled 2016-07-22: qty 1.8

## 2016-07-22 NOTE — ED Provider Notes (Signed)
[facility] Provider Note: Lowella Dell, MD, FACEP  CSN: [id number] MRN: [id number] ARRIVAL: 07/22/16 at 0250 ROOM: [facility]02/[facility]02   CHIEF COMPLAINT  Dental Pain   HISTORY OF PRESENT ILLNESS  Olivia Mcneil is a 36 y.o. female with a fractured right upper first molar. She has had pain in that tooth before but not recently. She was eating chicken yesterday and the tooth fractured again. She has subsequently developed severe pain in that tooth, worse with eating or drinking. She has taken ibuprofen without relief. She does not have a Education officer, community.  Consultation with the [place] state controlled substances database reveals the patient has received one prescription for tramadol in the past year.   Past Medical History:  Diagnosis Date  . Uterine polyp 2002    Past Surgical History:  Procedure Laterality Date  . CESAREAN SECTION    . PELVIC LAPAROSCOPY  2002   for uterine polyps  . TUBAL LIGATION  2008    Family History  Problem Relation Age of Onset  . Hypertension Mother     Social History  Substance Use Topics  . Smoking status: Current Every Day Smoker  . Smokeless tobacco: Never Used  . Alcohol use Yes     Comment: monthly    Prior to Admission medications   Medication Sig Start Date End Date Taking? Authorizing Provider  acetaminophen (TYLENOL) 500 MG tablet Take 1,000 mg by mouth every 6 (six) hours as needed. For headache     Historical Provider, MD  ibuprofen (ADVIL,MOTRIN) 600 MG tablet Take 1 tablet (600 mg total) by mouth every 6 (six) hours as needed. 12/13/15   Renne Crigler, PA-C  penicillin v potassium (VEETID) 500 MG tablet Take 1 tablet (500 mg total) by mouth 3 (three) times daily. 12/13/15   Renne Crigler, PA-C  traMADol (ULTRAM) 50 MG tablet Take 1 tablet (50 mg total) by mouth every 6 (six) hours as needed. 12/13/15   Renne Crigler, PA-C    Allergies Patient has no known allergies.   REVIEW OF SYSTEMS  Negative except as noted here or in  the History of Present Illness.   PHYSICAL EXAMINATION  Initial Vital Signs Blood pressure 124/64, pulse 85, temperature 98 F (36.7 C), temperature source Oral, resp. rate 20, height  (1.499 m), weight 146 lb (66.2 kg), SpO2 100 %.  Examination General: Well-developed, well-nourished female in no acute distress; appearance consistent with age of record HENT: normocephalic; atraumatic; deeply carious right upper first molar tenderness to percussion Eyes: pupils equal, round and reactive to light; extraocular muscles intact Neck: supple Heart: regular rate and rhythm Lungs: clear to auscultation bilaterally Abdomen: soft; nondistended; nontender; bowel sounds present Extremities: No deformity; full range of motion Neurologic: Awake, alert and oriented; motor function intact in all extremities and symmetric; no facial droop Skin: Warm and dry Psychiatric: Tearful   RESULTS  Summary of this visit's results, reviewed by myself:   EKG Interpretation  Date/Time:    Ventricular Rate:    PR Interval:    QRS Duration:   QT Interval:    QTC Calculation:   R Axis:     Text Interpretation:        Laboratory Studies: No results found for this or any previous visit (from the past 24 hour(s)). Imaging Studies: No results found.  ED COURSE  Nursing notes and initial vitals signs, including pulse oximetry, reviewed.  Vitals:   07/22/16 0257  BP: 124/64  Pulse: 85  Resp: 20  Temp: 98 F (36.7 C)  TempSrc: Oral  SpO2: 100%  Weight: 146 lb (66.2 kg)  Height:  (1.499 m)    PROCEDURES   DENTAL BLOCK 1.8 milliliters of 0.5% bupivacaine with epinephrine were injected into the buccal fold adjacent to the right upper first molar. The patient tolerated this well and there were no immediate complications. Adequate analgesia was obtained.   ED DIAGNOSES     ICD-9-CM ICD-10-CM   1. Pain, dental 525.9 K08.89        Paula Libra, MD 07/22/16 0530

## 2016-07-22 NOTE — ED Triage Notes (Signed)
c/o pain to rt upper tooth onset yesterday  Hx of same

## 2017-10-30 ENCOUNTER — Encounter (HOSPITAL_BASED_OUTPATIENT_CLINIC_OR_DEPARTMENT_OTHER): Payer: Self-pay | Admitting: Emergency Medicine

## 2017-10-30 ENCOUNTER — Emergency Department (HOSPITAL_BASED_OUTPATIENT_CLINIC_OR_DEPARTMENT_OTHER)
Admission: EM | Admit: 2017-10-30 | Discharge: 2017-10-30 | Disposition: A | Discharge status: Home / Self Care | Payer: Self-pay | Attending: Emergency Medicine | Admitting: Emergency Medicine

## 2017-10-30 ENCOUNTER — Other Ambulatory Visit: Payer: Self-pay

## 2017-10-30 DIAGNOSIS — F17200 Nicotine dependence, unspecified, uncomplicated: Secondary | ICD-10-CM | POA: Insufficient documentation

## 2017-10-30 DIAGNOSIS — K0889 Other specified disorders of teeth and supporting structures: Secondary | ICD-10-CM | POA: Insufficient documentation

## 2017-10-30 MED ORDER — NAPROXEN 250 MG PO TABS
500.0000 mg | ORAL_TABLET | Freq: Once | ORAL | Status: AC
  Administered 2017-10-30: 500 mg via ORAL
  Filled 2017-10-30: qty 2

## 2017-10-30 MED ORDER — PENICILLIN V POTASSIUM 250 MG PO TABS
500.0000 mg | ORAL_TABLET | Freq: Once | ORAL | Status: AC
  Administered 2017-10-30: 500 mg via ORAL
  Filled 2017-10-30: qty 2

## 2017-10-30 MED ORDER — NAPROXEN 500 MG PO TABS: 500.0000 mg | ORAL_TABLET | Freq: Two times a day (BID) | ORAL | 0 refills | Status: DC

## 2017-10-30 MED ORDER — PENICILLIN V POTASSIUM 500 MG PO TABS: 500.0000 mg | ORAL_TABLET | Freq: Four times a day (QID) | ORAL | 0 refills | Status: AC

## 2017-10-30 MED FILL — NAPROXEN 500 MG TABLET: 500 | 15 days supply | Qty: 30 | Fill #0

## 2017-10-30 MED FILL — PENICILLIN VK 500 MG TABLET: 500 | 7 days supply | Qty: 28 | Fill #0

## 2017-10-30 NOTE — ED Provider Notes (Signed)
[facility] EMERGENCY DEPARTMENT Provider Note   CSN: [id number] Arrival date & time: 10/30/17  1007   History   Chief Complaint Chief Complaint  Patient presents with  . Dental Pain    HPI Olivia Mcneil is a 37 y.o. female with a hx of tobacco abuse and tubal ligation who presents to the ED with complaints of dental pain over past 24 hours.  Patient states that she has a tooth that has been chipped for an extended period of time in the left upper gingiva, she relays that last night she further chipped it it while she was eating.  This morning she woke up with throbbing pain to this area, rates her pain an 8 out of 10 in severity at present, no specific alleviating or aggravating factors.  She did try Tylenol prior to arrival without significant relief.   Denies fever, chills, facial swelling, nausea, vomiting, drooling, change in voice, or neck pain.  Patient does not see a dentist regularly.  She denies chance of pregnancy.  HPI  Past Medical History:  Diagnosis Date  . Uterine polyp 2002    There are no active problems to display for this patient.   Past Surgical History:  Procedure Laterality Date  . CESAREAN SECTION    . PELVIC LAPAROSCOPY  2002   for uterine polyps  . TUBAL LIGATION  2008     OB History   None      Home Medications    Prior to Admission medications   Medication Sig Start Date End Date Taking? Authorizing Provider  ibuprofen (ADVIL,MOTRIN) 600 MG tablet Take 1 tablet (600 mg total) by mouth every 6 (six) hours as needed. 12/13/15   Renne Crigler, PA-C  penicillin v potassium (VEETID) 500 MG tablet Take 1 tablet (500 mg total) by mouth 3 (three) times daily. 07/22/16   Molpus, John, MD    Family History Family History  Problem Relation Age of Onset  . Hypertension Mother     Social History Social History   Tobacco Use  . Smoking status: Current Every Day Smoker  . Smokeless tobacco: Never Used  Substance Use Topics  . Alcohol  use: Yes    Comment: monthly  . Drug use: No     Allergies   Patient has no known allergies.   Review of Systems Review of Systems  Constitutional: Negative for chills and fever.  HENT: Positive for dental problem. Negative for drooling, ear pain, sore throat, trouble swallowing and voice change.   Respiratory: Negative for shortness of breath.   Musculoskeletal: Negative for neck pain and neck stiffness.   Physical Exam Updated Vital Signs BP 122/74   Pulse 73   Temp 97.8 F (36.6 C) (Oral)   Resp 16   Ht 4\' 11"  (1.499 m)   Wt 62.1 kg (137 lb)   LMP 10/23/2017 (Approximate)   SpO2 100%   BMI 27.67 kg/m   Physical Exam  Constitutional: She appears well-developed and well-nourished.  Non-toxic appearance. No distress.  HENT:  Head: Normocephalic and atraumatic.  Right Ear: Tympanic membrane and ear canal normal. Tympanic membrane is not perforated, not erythematous, not retracted and not bulging.  Left Ear: Tympanic membrane and ear canal normal. Tympanic membrane is not perforated, not erythematous, not retracted and not bulging.  Nose: Nose normal.  Mouth/Throat: Uvula is midline. No uvula swelling. No oropharyngeal exudate, posterior oropharyngeal erythema or tonsillar abscesses.    Patient is tolerating her own secretions without difficulty, no  trismus, no drooling, no hot potato voice, submandibular compartment is soft.  Eyes: Conjunctivae are normal. Right eye exhibits no discharge. Left eye exhibits no discharge.  Neck: Normal range of motion. Neck supple. No neck rigidity. No edema and no erythema present.  Cardiovascular: Normal rate and regular rhythm.  No murmur heard. Pulmonary/Chest: Effort normal and breath sounds normal.  Lymphadenopathy:    She has no cervical adenopathy.  Neurological: She is alert.  Psychiatric: She has a normal mood and affect. Her behavior is normal. Thought content normal.  Nursing note and vitals reviewed.   ED Treatments /  Results  Labs (all labs ordered are listed, but only abnormal results are displayed) Labs Reviewed - No data to display  EKG None  Radiology No results found.  Procedures Procedures (including critical care time)  Medications Ordered in ED Medications  naproxen (NAPROSYN) tablet 500 mg (has no administration in time range)  penicillin v potassium (VEETID) tablet 500 mg (has no administration in time range)     Initial Impression / Assessment and Plan / ED Course  I have reviewed the triage vital signs and the nursing notes.  Pertinent labs & imaging results that were available during my care of the patient were reviewed by me and considered in my medical decision making (see chart for details).    Patient presents with dental pain. Patient is nontoxic appearing, vitals WNL. No gross abscess.  Exam unconcerning for Ludwig's angina or spread of infection.  Will treat with Penicillin VK and Naproxen.  Urged patient to follow-up with dentist, dental resources were provided.  Discussed treatment plan and need for follow up as well as return precautions. Provided opportunity for questions, patient confirmed understanding and is agreeable to plan.  Final Clinical Impressions(s) / ED Diagnoses   Final diagnoses:  Pain, dental    ED Discharge Orders        Ordered    naproxen (NAPROSYN) 500 MG tablet  2 times daily     10/30/17 1125    penicillin v potassium (VEETID) 500 MG tablet  4 times daily     10/30/17 [address]1125       Latavius Capizzi, [place] RougeSamantha R, PA-C 10/30/17 1855    Tilden Fossaees, Elizabeth, MD 10/31/17 [phone]

## 2017-10-30 NOTE — Discharge Instructions (Signed)
Call one of the dentists offices provided to schedule an appointment for re-evaluation and further management within the next 48 hours.   I have prescribed you Penicillin VK which is an antibiotic to treat the infection and Naproxen which is an anti-inflammatory medicine to treat the pain.   Please take all of your antibiotics until finished. You may develop abdominal discomfort or diarrhea from the antibiotic.  You may help offset this with probiotics which you can buy at the store (ask your pharmacist if unable to find) or get probiotics in the form of eating yogurt. Do not eat or take the probiotics until 2 hours after your antibiotic. If you are unable to tolerate these side effects follow-up with your primary care provider or return to the emergency department.   If you begin to experience any blistering, rashes, swelling, or difficulty breathing seek medical care for evaluation of potentially more serious side effects.   Be sure to eat something when taking the Naproxen as it can cause stomach upset and at worst stomach bleeding. Do not take additional non steroidal anti-inflammatory medicines such as Ibuprofen, Aleve, or Advil while taking Naproxen. You may supplement with Tylenol.   Please be aware that this medications may interact with other medications you are taking, please be sure to discuss your medication list with your pharmacist. If you are taking birth control the antibiotic will deactivate your birth control for 2 weeks.   If you start to experience and new or worsening symptoms return to the emergency department. If you start to experience fever, chills, neck stiffness/pain, or inability to move your neck come back to the emergency department immediately.

## 2017-10-30 NOTE — ED Triage Notes (Signed)
Reports left upper dental pain since this morning.  Reports tooth recently chipped.

## 2019-06-11 ENCOUNTER — Encounter (HOSPITAL_BASED_OUTPATIENT_CLINIC_OR_DEPARTMENT_OTHER): Payer: Self-pay

## 2019-06-11 ENCOUNTER — Emergency Department (HOSPITAL_BASED_OUTPATIENT_CLINIC_OR_DEPARTMENT_OTHER)
Admission: EM | Admit: 2019-06-11 | Discharge: 2019-06-11 | Disposition: A | Discharge status: Home / Self Care | Payer: Self-pay | Attending: Emergency Medicine | Admitting: Emergency Medicine

## 2019-06-11 ENCOUNTER — Other Ambulatory Visit: Payer: Self-pay

## 2019-06-11 DIAGNOSIS — F17210 Nicotine dependence, cigarettes, uncomplicated: Secondary | ICD-10-CM | POA: Insufficient documentation

## 2019-06-11 DIAGNOSIS — K0401 Reversible pulpitis: Secondary | ICD-10-CM | POA: Insufficient documentation

## 2019-06-11 MED ORDER — PENICILLIN V POTASSIUM 250 MG PO TABS
500.0000 mg | ORAL_TABLET | Freq: Once | ORAL | Status: AC
  Administered 2019-06-11: 500 mg via ORAL
  Filled 2019-06-11: qty 2

## 2019-06-11 MED ORDER — CHLORHEXIDINE GLUCONATE 0.12 % MT SOLN: 15.0000 mL | Freq: Two times a day (BID) | OROMUCOSAL | 0 refills | Status: DC

## 2019-06-11 MED ORDER — PENICILLIN V POTASSIUM 500 MG PO TABS: 500.0000 mg | ORAL_TABLET | Freq: Four times a day (QID) | ORAL | 0 refills | Status: AC

## 2019-06-11 MED ORDER — IBUPROFEN 400 MG PO TABS
600.0000 mg | ORAL_TABLET | Freq: Once | ORAL | Status: AC
  Administered 2019-06-11: 600 mg via ORAL
  Filled 2019-06-11: qty 1

## 2019-06-11 MED ORDER — IBUPROFEN 600 MG PO TABS: 600.0000 mg | ORAL_TABLET | Freq: Four times a day (QID) | ORAL | 0 refills | Status: DC | PRN

## 2019-06-11 NOTE — ED Notes (Signed)
ED Provider at bedside. 

## 2019-06-11 NOTE — ED Triage Notes (Signed)
Pt arrives to ED with c/o dental pain starting last night reports pain is worse on upper right but reports pain on both sides.

## 2019-06-11 NOTE — ED Provider Notes (Signed)
[facility] EMERGENCY DEPARTMENT Provider Note   CSN: [id number] Arrival date & time: 06/11/19  1718     History Chief Complaint  Patient presents with  . Dental Pain    Olivia Mcneil is a 39 y.o. female.  Olivia BOORD is a 39 y.o. female with a history of uterine polyps, who presents for dental pain.  Pain started last night.  Pain is located over the right upper molars and began after patient was eating tortilla chips and felt like something poked up into her gum.  She has multiple missing teeth, and a history of frequent dental infections.  She denies any fevers or chills.  No associated nausea or vomiting.  No pain or swelling under the tongue or pain or swelling in the neck.  Pain is a constant throbbing ache, she got mild improvement yesterday with Tylenol, but Tylenol has not helped today, she has not taken anything else to treat her symptoms.  No other aggravating or alleviating factors.        Past Medical History:  Diagnosis Date  . Uterine polyp 2002    There are no problems to display for this patient.   Past Surgical History:  Procedure Laterality Date  . CESAREAN SECTION    . PELVIC LAPAROSCOPY  2002   for uterine polyps  . TUBAL LIGATION  2008     OB History   No obstetric history on file.     Family History  Problem Relation Age of Onset  . Hypertension Mother     Social History   Tobacco Use  . Smoking status: Current Every Day Smoker  . Smokeless tobacco: Never Used  Substance Use Topics  . Alcohol use: Yes    Comment: monthly  . Drug use: No    Home Medications Prior to Admission medications   Medication Sig Start Date End Date Taking? Authorizing Provider  chlorhexidine (PERIDEX) 0.12 % solution Use as directed 15 mLs in the mouth or throat 2 (two) times daily. 06/11/19   Dartha Lodge, PA-C  ibuprofen (ADVIL) 600 MG tablet Take 1 tablet (600 mg total) by mouth every 6 (six) hours as needed. 06/11/19   Dartha Lodge, PA-C    naproxen (NAPROSYN) 500 MG tablet Take 1 tablet (500 mg total) by mouth 2 (two) times daily. 10/30/17   Petrucelli, Samantha R, PA-C  penicillin v potassium (VEETID) 500 MG tablet Take 1 tablet (500 mg total) by mouth 4 (four) times daily for 7 days. 06/11/19 06/18/19  Dartha Lodge, PA-C    Allergies    Patient has no known allergies.  Review of Systems   Review of Systems  Constitutional: Negative for chills and fever.  HENT: Positive for dental problem. Negative for facial swelling, sore throat, trouble swallowing and voice change.   Gastrointestinal: Negative for nausea and vomiting.  Musculoskeletal: Negative for neck pain and neck stiffness.  Skin: Negative for color change and rash.    Physical Exam Updated Vital Signs BP 127/64 (BP Location: Left Arm)   Pulse 82   Temp 98.5 F (36.9 C) (Oral)   Resp 16   Ht 4\' 11"  (1.499 m)   Wt 62.1 kg   LMP 05/31/2019   SpO2 100%   BMI 27.67 kg/m   Physical Exam Vitals and nursing note reviewed.  Constitutional:      General: She is not in acute distress.    Appearance: Normal appearance. She is well-developed and normal weight. She is  not ill-appearing or diaphoretic.  HENT:     Head: Normocephalic and atraumatic.     Mouth/Throat:     Comments: Multiple teeth missing and and poor decay, pain over the upper left molars with area where tooth is missing and there is opening in the gums, no obvious foreign body, no drainable abscess.  No tenderness or pain over the roof of mouth, no sublingual tenderness or pain.  Posterior oropharynx is clear.  Normal phonation, no trismus. Eyes:     General:        Right eye: No discharge.        Left eye: No discharge.  Neck:     Comments: No neck tenderness or lymphadenopathy, normal range of motion Pulmonary:     Effort: Pulmonary effort is normal. No respiratory distress.  Musculoskeletal:     Cervical back: Normal range of motion and neck supple. No tenderness.  Skin:    General: Skin is  warm and dry.  Neurological:     Mental Status: She is alert and oriented to person, place, and time.     Coordination: Coordination normal.  Psychiatric:        Mood and Affect: Mood normal.        Behavior: Behavior normal.     ED Results / Procedures / Treatments   Labs (all labs ordered are listed, but only abnormal results are displayed) Labs Reviewed - No data to display  EKG None  Radiology No results found.  Procedures Procedures (including critical care time)  Medications Ordered in ED Medications  ibuprofen (ADVIL) tablet 600 mg (600 mg Oral Given 06/11/19 1817)  penicillin v potassium (VEETID) tablet 500 mg (500 mg Oral Given 06/11/19 1817)    ED Course  I have reviewed the triage vital signs and the nursing notes.  Pertinent labs & imaging results that were available during my care of the patient were reviewed by me and considered in my medical decision making (see chart for details).    MDM Rules/Calculators/A&P                     Patient with toothache.  No gross abscess.  Exam unconcerning for Ludwig's angina or spread of infection.  Will treat with penicillin and anti-inflammatories medicine.  Will prescribe Peridex mouthwash.  Urged patient to follow-up with dentist.    Final Clinical Impression(s) / ED Diagnoses Final diagnoses:  Pulpitis    Rx / DC Orders ED Discharge Orders         Ordered    penicillin v potassium (VEETID) 500 MG tablet  4 times daily     06/11/19 1812    ibuprofen (ADVIL) 600 MG tablet  Every 6 hours PRN     06/11/19 1812    chlorhexidine (PERIDEX) 0.12 % solution  2 times daily     06/11/19 1812           Samhitha Berlin 06/11/19 1912    Charlesetta Shanks, MD 06/11/19 2338

## 2019-06-11 NOTE — Discharge Instructions (Addendum)
Please take entire course of antibiotics as directed.  Continue using ibuprofen and Tylenol for pain.  Please rinse your mouth with warm salt water after eating meals and use Peridex mouthwash after brushing your teeth in the morning and night.  You will need to follow-up with your dentist for continued management of this.  Return to the emergency department for fevers, swelling or pain under the tongue or in the neck, difficulty breathing or swallowing or any other new or concerning symptoms.

## 2020-08-07 ENCOUNTER — Emergency Department (HOSPITAL_BASED_OUTPATIENT_CLINIC_OR_DEPARTMENT_OTHER)
Admission: EM | Admit: 2020-08-07 | Discharge: 2020-08-07 | Disposition: A | Discharge status: Home / Self Care | Payer: Self-pay | Attending: Emergency Medicine | Admitting: Emergency Medicine

## 2020-08-07 ENCOUNTER — Other Ambulatory Visit: Payer: Self-pay

## 2020-08-07 ENCOUNTER — Encounter (HOSPITAL_BASED_OUTPATIENT_CLINIC_OR_DEPARTMENT_OTHER): Payer: Self-pay | Admitting: Emergency Medicine

## 2020-08-07 DIAGNOSIS — Z202 Contact with and (suspected) exposure to infections with a predominantly sexual mode of transmission: Secondary | ICD-10-CM | POA: Insufficient documentation

## 2020-08-07 DIAGNOSIS — F17200 Nicotine dependence, unspecified, uncomplicated: Secondary | ICD-10-CM | POA: Insufficient documentation

## 2020-08-07 LAB — WET PREP, GENITAL
Sperm: NONE SEEN
Trich, Wet Prep: NONE SEEN
Yeast Wet Prep HPF POC: NONE SEEN

## 2020-08-07 MED ORDER — LIDOCAINE HCL (PF) 1 % IJ SOLN
2.0000 mL | Freq: Once | INTRAMUSCULAR | Status: AC
  Administered 2020-08-07: 2 mL
  Filled 2020-08-07: qty 5

## 2020-08-07 MED ORDER — CEFTRIAXONE SODIUM 500 MG IJ SOLR
500.0000 mg | Freq: Once | INTRAMUSCULAR | Status: AC
  Administered 2020-08-07: 500 mg via INTRAMUSCULAR
  Filled 2020-08-07: qty 500

## 2020-08-07 NOTE — ED Notes (Signed)
No s/s of reaction noted to IM injection.  Discharged at this time.

## 2020-08-07 NOTE — ED Triage Notes (Signed)
Reports her ex notified her he was positive for gonorrhea so she would like to be tested.

## 2020-08-07 NOTE — ED Provider Notes (Signed)
[facility] EMERGENCY DEPARTMENT Provider Note   CSN: [id number] Arrival date & time: 08/07/20  1225     History Chief Complaint  Patient presents with  . Exposure to STD    Olivia Mcneil is a 40 y.o. female.  Patient has sex with someone who tested positive for gonorrhea.  Here for treatment and testing.  No symptoms.  The history is provided by the patient.  Exposure to STD This is a new problem. The problem has not changed since onset.Nothing aggravates the symptoms. Nothing relieves the symptoms. She has tried nothing for the symptoms.       Past Medical History:  Diagnosis Date  . Uterine polyp 2002    There are no problems to display for this patient.   Past Surgical History:  Procedure Laterality Date  . CESAREAN SECTION    . PELVIC LAPAROSCOPY  2002   for uterine polyps  . TUBAL LIGATION  2008     OB History   No obstetric history on file.     Family History  Problem Relation Age of Onset  . Hypertension Mother     Social History   Tobacco Use  . Smoking status: Current Every Day Smoker  . Smokeless tobacco: Never Used  Substance Use Topics  . Alcohol use: Yes    Comment: monthly  . Drug use: No    Home Medications Prior to Admission medications   Medication Sig Start Date End Date Taking? Authorizing Provider  chlorhexidine (PERIDEX) 0.12 % solution Use as directed 15 mLs in the mouth or throat 2 (two) times daily. 06/11/19   Dartha Lodge, PA-C  ibuprofen (ADVIL) 600 MG tablet Take 1 tablet (600 mg total) by mouth every 6 (six) hours as needed. 06/11/19   Dartha Lodge, PA-C  naproxen (NAPROSYN) 500 MG tablet Take 1 tablet (500 mg total) by mouth 2 (two) times daily. 10/30/17   Petrucelli, Pleas Koch, PA-C    Allergies    Patient has no known allergies.  Review of Systems   Review of Systems  Genitourinary: Negative for genital sores, pelvic pain and vaginal discharge.    Physical Exam Updated Vital Signs BP (!) 153/65  (BP Location: Right Arm)   Pulse (!) 104   Temp 98.4 F (36.9 C) (Oral)   Resp 18   Ht 4\' 11"  (1.499 m)   Wt 62.1 kg   LMP 07/24/2020   SpO2 100%   BMI 27.65 kg/m   Physical Exam Constitutional:      General: She is not in acute distress.    Appearance: She is not ill-appearing.  Abdominal:     General: There is no distension.     Tenderness: There is no abdominal tenderness.  Neurological:     Mental Status: She is alert.     ED Results / Procedures / Treatments   Labs (all labs ordered are listed, but only abnormal results are displayed) Labs Reviewed  WET PREP, GENITAL  GC/CHLAMYDIA PROBE AMP ([place]) NOT AT [facility]    EKG None  Radiology No results found.  Procedures Procedures   Medications Ordered in ED Medications  cefTRIAXone (ROCEPHIN) injection 500 mg (has no administration in time range)  lidocaine (PF) (XYLOCAINE) 1 % injection 2 mL (has no administration in time range)    ED Course  I have reviewed the triage vital signs and the nursing notes.  Pertinent labs & imaging results that were available during my care of the patient  were reviewed by me and considered in my medical decision making (see chart for details).    MDM Rules/Calculators/A&P                          Olivia Mcneil is here for treatment for gonorrhea.  Was exposed to someone who tested positive for gonorrhea.  She has no symptoms.  Patient self swab.  Treated with Rocephin.  Discharged.  This chart was dictated using voice recognition software.  Despite best efforts to proofread,  errors can occur which can change the documentation meaning.   Final Clinical Impression(s) / ED Diagnoses Final diagnoses:  Exposure to STD    Rx / DC Orders ED Discharge Orders    None       Virgina Norfolk, DO 08/07/20 1242

## 2020-08-10 LAB — GC/CHLAMYDIA PROBE AMP (~~LOC~~) NOT AT ARMC
Chlamydia: NEGATIVE
Comment: NEGATIVE
Comment: NORMAL
Neisseria Gonorrhea: NEGATIVE

## 2021-01-15 ENCOUNTER — Emergency Department (HOSPITAL_BASED_OUTPATIENT_CLINIC_OR_DEPARTMENT_OTHER)
Admission: EM | Admit: 2021-01-15 | Discharge: 2021-01-15 | Disposition: A | Discharge status: Home / Self Care | Payer: Self-pay | Attending: Emergency Medicine | Admitting: Emergency Medicine

## 2021-01-15 ENCOUNTER — Encounter (HOSPITAL_BASED_OUTPATIENT_CLINIC_OR_DEPARTMENT_OTHER): Payer: Self-pay

## 2021-01-15 ENCOUNTER — Other Ambulatory Visit: Payer: Self-pay

## 2021-01-15 DIAGNOSIS — R5381 Other malaise: Secondary | ICD-10-CM | POA: Insufficient documentation

## 2021-01-15 DIAGNOSIS — F17200 Nicotine dependence, unspecified, uncomplicated: Secondary | ICD-10-CM | POA: Insufficient documentation

## 2021-01-15 DIAGNOSIS — M791 Myalgia, unspecified site: Secondary | ICD-10-CM | POA: Insufficient documentation

## 2021-01-15 DIAGNOSIS — J029 Acute pharyngitis, unspecified: Secondary | ICD-10-CM | POA: Insufficient documentation

## 2021-01-15 DIAGNOSIS — R059 Cough, unspecified: Secondary | ICD-10-CM | POA: Insufficient documentation

## 2021-01-15 DIAGNOSIS — R5383 Other fatigue: Secondary | ICD-10-CM | POA: Insufficient documentation

## 2021-01-15 DIAGNOSIS — R051 Acute cough: Secondary | ICD-10-CM

## 2021-01-15 DIAGNOSIS — R6883 Chills (without fever): Secondary | ICD-10-CM | POA: Insufficient documentation

## 2021-01-15 DIAGNOSIS — Z20822 Contact with and (suspected) exposure to covid-19: Secondary | ICD-10-CM | POA: Insufficient documentation

## 2021-01-15 LAB — RESP PANEL BY RT-PCR (FLU A&B, COVID) ARPGX2
Influenza A by PCR: NEGATIVE
Influenza B by PCR: NEGATIVE
SARS Coronavirus 2 by RT PCR: NEGATIVE

## 2021-01-15 NOTE — ED Triage Notes (Signed)
Pt arrives with c/o body aches, sore throat, fatigue, and loss of taste since Wednesday with coughing at night when lying down. Pt A&OX4 ambulatory to triage. Last took ibuprofen around 11 am today.

## 2021-01-15 NOTE — Discharge Instructions (Addendum)
You were seen and evaluated in the emergency department for further evaluation of upper respiratory symptoms.  As we discussed, this is likely from a virus.  Please drink plenty of fluids and get plenty of rest.  Take over-the-counter decongestant medications and Tylenol ibuprofen for aches and pains.  Please turn to the emergency department if you experience new and worsening shortness of breath, severe and productive cough, fever that will not resolve with Tylenol or ibuprofen, or any other concern you may have.

## 2021-01-15 NOTE — ED Provider Notes (Signed)
[facility] EMERGENCY DEPARTMENT Provider Note   CSN: [id number] Arrival date & time: 01/15/21  1828     History Chief Complaint  Patient presents with   Fatigue    Olivia Mcneil is a 40 y.o. female who presents to the emergency department today with a 1 day history of productive cough with green sputum, general malaise, myalgias, sore throat, and fatigue.  She states that her daughter came down with a sore throat a few days ago and her symptoms started shortly afterward.  She denies any chest pain, fevers, shortness of breath, abdominal pain, nausea, vomiting, diarrhea, urinary complaints.  She does endorse however intermittent chills.  She has been using lozenges for cough and Tylenol for pain with little relief.  Her symptoms been constant since onset  The history is provided by the patient. No language interpreter was used.      Past Medical History:  Diagnosis Date   Uterine polyp 2002    There are no problems to display for this patient.   Past Surgical History:  Procedure Laterality Date   CESAREAN SECTION     PELVIC LAPAROSCOPY  2002   for uterine polyps   TUBAL LIGATION  2008     OB History   No obstetric history on file.     Family History  Problem Relation Age of Onset   Hypertension Mother     Social History   Tobacco Use   Smoking status: Every Day   Smokeless tobacco: Never  Vaping Use   Vaping Use: Never used  Substance Use Topics   Alcohol use: Yes    Comment: monthly   Drug use: No    Home Medications Prior to Admission medications   Medication Sig Start Date End Date Taking? Authorizing Provider  chlorhexidine (PERIDEX) 0.12 % solution Use as directed 15 mLs in the mouth or throat 2 (two) times daily. 06/11/19   Dartha Lodge, PA-C  ibuprofen (ADVIL) 600 MG tablet Take 1 tablet (600 mg total) by mouth every 6 (six) hours as needed. 06/11/19   Dartha Lodge, PA-C  naproxen (NAPROSYN) 500 MG tablet Take 1 tablet (500 mg total)  by mouth 2 (two) times daily. 10/30/17   Petrucelli, Pleas Koch, PA-C    Allergies    Patient has no known allergies.  Review of Systems   Review of Systems  All other systems reviewed and are negative.  Physical Exam Updated Vital Signs BP 133/85 (BP Location: Left Arm)   Pulse 95   Temp 98.8 F (37.1 C) (Oral)   Resp 16   Ht 4\' 11"  (1.499 m)   Wt 64.4 kg   LMP 01/02/2021   SpO2 100%   BMI 28.68 kg/m   Physical Exam Constitutional:      General: She is not in acute distress.    Appearance: Normal appearance.  HENT:     Head: Normocephalic and atraumatic.  Eyes:     General:        Right eye: No discharge.        Left eye: No discharge.  Cardiovascular:     Comments: Regular rate and rhythm.  S1/S2 are distinct without any evidence of murmur, rubs, or gallops.  Radial pulses are 2+ bilaterally.  Dorsalis pedis pulses are 2+ bilaterally.  No evidence of pedal edema. Pulmonary:     Comments: Clear to auscultation bilaterally.  Normal effort.  No respiratory distress.  No evidence of wheezes, rales, or rhonchi heard throughout.  Musculoskeletal:        General: Normal range of motion.     Cervical back: Neck supple.  Skin:    General: Skin is warm and dry.     Findings: No rash.  Neurological:     General: No focal deficit present.     Mental Status: She is alert.  Psychiatric:        Mood and Affect: Mood normal.        Behavior: Behavior normal.    ED Results / Procedures / Treatments   Labs (all labs ordered are listed, but only abnormal results are displayed) Labs Reviewed  RESP PANEL BY RT-PCR (FLU A&B, COVID) ARPGX2    EKG None  Radiology No results found.  Procedures Procedures   Medications Ordered in ED Medications - No data to display  ED Course  I have reviewed the triage vital signs and the nursing notes.  Pertinent labs & imaging results that were available during my care of the patient were reviewed by me and considered in my medical  decision making (see chart for details).    MDM Rules/Calculators/A&P                           Olivia Mcneil is a 40 y.o. female who presents the emergency department for evaluation of URI symptoms.  History and physical exam is less concerning for pneumonia or any other severe infectious causes.  I have a low suspicion for sinusitis.  I do not think that this is ACS, CHF or COPD.  COVID and influenza were negative.  This is still likely a upper respiratory infection caused by a virus.  I instructed the patient to use conservative measures including warm steam, Tylenol/ibuprofen for pain, mucosal decongestants, and loads of fluids.  She expressed full understanding.  Strict return precautions were given.  She is hemodynamically stable and safe for discharge.  Final Clinical Impression(s) / ED Diagnoses Final diagnoses:  Acute cough    Rx / DC Orders ED Discharge Orders     None        Teressa Lower, [place] 01/15/21 1951    Gwyneth Sprout, MD 01/15/21 2318

## 2021-03-11 ENCOUNTER — Encounter (HOSPITAL_BASED_OUTPATIENT_CLINIC_OR_DEPARTMENT_OTHER): Payer: Self-pay | Admitting: *Deleted

## 2021-03-11 ENCOUNTER — Emergency Department (HOSPITAL_BASED_OUTPATIENT_CLINIC_OR_DEPARTMENT_OTHER): Payer: Self-pay

## 2021-03-11 ENCOUNTER — Emergency Department (HOSPITAL_BASED_OUTPATIENT_CLINIC_OR_DEPARTMENT_OTHER)
Admission: EM | Admit: 2021-03-11 | Discharge: 2021-03-11 | Disposition: A | Discharge status: Home / Self Care | Payer: Self-pay | Attending: Emergency Medicine | Admitting: Emergency Medicine

## 2021-03-11 ENCOUNTER — Other Ambulatory Visit: Payer: Self-pay

## 2021-03-11 DIAGNOSIS — S8991XA Unspecified injury of right lower leg, initial encounter: Secondary | ICD-10-CM | POA: Insufficient documentation

## 2021-03-11 DIAGNOSIS — Y93K1 Activity, walking an animal: Secondary | ICD-10-CM | POA: Insufficient documentation

## 2021-03-11 DIAGNOSIS — F17200 Nicotine dependence, unspecified, uncomplicated: Secondary | ICD-10-CM | POA: Insufficient documentation

## 2021-03-11 DIAGNOSIS — Y92480 Sidewalk as the place of occurrence of the external cause: Secondary | ICD-10-CM | POA: Insufficient documentation

## 2021-03-11 DIAGNOSIS — M25561 Pain in right knee: Secondary | ICD-10-CM | POA: Insufficient documentation

## 2021-03-11 DIAGNOSIS — W101XXA Fall (on)(from) sidewalk curb, initial encounter: Secondary | ICD-10-CM | POA: Insufficient documentation

## 2021-03-11 MED ORDER — HYDROCODONE-ACETAMINOPHEN 5-325 MG PO TABS: 1.0000 | ORAL_TABLET | Freq: Four times a day (QID) | ORAL | 0 refills | Status: DC | PRN

## 2021-03-11 NOTE — Discharge Instructions (Addendum)
Wear a knee immobilizer at all times to maintain stability of your right knee.  Use crutches to prevent from putting weight on your right foot/leg.  Given your x-ray findings, it is possible that you may have a ligament injury.  This should be followed by an orthopedic specialist.  Call in the morning to schedule an appointment to be seen by an orthopedist.  You may use 600 mg ibuprofen every 6 hours for management of pain.  If pain is severe, take Norco as prescribed.  Do not drive or drink alcohol after taking this medication as it may make you drowsy and impair your judgment.  Return for new or concerning symptoms.

## 2021-03-11 NOTE — ED Triage Notes (Addendum)
C/o mechanical fall with knee injury x 6 hrs ago, PTA motrin , ice

## 2021-03-11 NOTE — ED Provider Notes (Signed)
[facility] EMERGENCY DEPARTMENT Provider Note   CSN: [id number] Arrival date & time: 03/11/21  1647     History Chief Complaint  Patient presents with   Knee Injury    Olivia Mcneil is a 40 y.o. female.  40 year old female presents to the emergency department for evaluation of right knee pain.  Onset of pain was at 11 AM.  Reports that she was walking her dog and he was playing when he ran into her and her knees struck the curb.  Her pain has been constant and is worse with weightbearing, movement.  She has tried ibuprofen and icing with no improvement.  No numbness or weakness.  Has not been followed by an orthopedist in the past.  The history is provided by the patient. No language interpreter was used.      Past Medical History:  Diagnosis Date   Uterine polyp 2002    There are no problems to display for this patient.   Past Surgical History:  Procedure Laterality Date   CESAREAN SECTION     PELVIC LAPAROSCOPY  2002   for uterine polyps   TUBAL LIGATION  2008     OB History   No obstetric history on file.     Family History  Problem Relation Age of Onset   Hypertension Mother     Social History   Tobacco Use   Smoking status: Every Day   Smokeless tobacco: Never  Vaping Use   Vaping Use: Never used  Substance Use Topics   Alcohol use: Yes    Comment: monthly   Drug use: No    Home Medications Prior to Admission medications   Medication Sig Start Date End Date Taking? Authorizing Provider  HYDROcodone-acetaminophen (NORCO/VICODIN) 5-325 MG tablet Take 1-2 tablets by mouth every 6 (six) hours as needed for severe pain. 03/11/21  Yes Antony Madura, PA-C  chlorhexidine (PERIDEX) 0.12 % solution Use as directed 15 mLs in the mouth or throat 2 (two) times daily. 06/11/19   Dartha Lodge, PA-C  ibuprofen (ADVIL) 600 MG tablet Take 1 tablet (600 mg total) by mouth every 6 (six) hours as needed. 06/11/19   Dartha Lodge, PA-C  naproxen (NAPROSYN)  500 MG tablet Take 1 tablet (500 mg total) by mouth 2 (two) times daily. 10/30/17   Petrucelli, Pleas Koch, PA-C    Allergies    Patient has no known allergies.  Review of Systems   Review of Systems Ten systems reviewed and are negative for acute change, except as noted in the HPI.    Physical Exam Updated Vital Signs BP 130/78 (BP Location: Right Arm)   Pulse 82   Temp 99.1 F (37.3 C) (Oral)   Resp 20   Ht 4\' 11"  (1.499 m)   Wt 64.9 kg   LMP 02/22/2021   SpO2 100%   BMI 28.88 kg/m   Physical Exam Vitals and nursing note reviewed.  Constitutional:      General: She is not in acute distress.    Appearance: She is well-developed. She is not diaphoretic.     Comments: Nontoxic-appearing and in no acute distress.  HENT:     Head: Normocephalic and atraumatic.  Eyes:     General: No scleral icterus.    Conjunctiva/sclera: Conjunctivae normal.  Cardiovascular:     Rate and Rhythm: Normal rate and regular rhythm.     Pulses: Normal pulses.     Comments: DP pulse 2+ in the right lower extremity  Pulmonary:     Effort: Pulmonary effort is normal. No respiratory distress.  Musculoskeletal:        General: Normal range of motion.     Cervical back: Normal range of motion.     Comments: Soft tissue swelling of the right knee without significant effusion.  No crepitus or deformity.  Pain with active and passive extension of the right knee as well as diffusely (medially, laterally, posteriorly).  No erythema, heat to touch.  Skin:    General: Skin is warm and dry.     Coloration: Skin is not pale.     Findings: No erythema or rash.  Neurological:     Mental Status: She is alert and oriented to person, place, and time.     Coordination: Coordination normal.     Comments: Sensation to light touch intact, RLE.  Psychiatric:        Behavior: Behavior normal.    ED Results / Procedures / Treatments   Labs (all labs ordered are listed, but only abnormal results are  displayed) Labs Reviewed - No data to display  EKG None  Radiology DG Knee Complete 4 Views Right  Result Date: 03/11/2021 CLINICAL DATA:  Fall. EXAM: RIGHT KNEE - COMPLETE 4+ VIEW COMPARISON:  None. FINDINGS: Joint effusion is present. On the oblique view there is a small linear lucency through the base of the tibial spines. The joint spaces are well maintained and alignment is anatomic. IMPRESSION: 1. Small linear lucency through the base of the tibial spines. Findings may be related to artifact/normal overlapping structures. Small acute nondisplaced fracture is not excluded. 2. Joint effusion. Electronically Signed   By: Darliss Cheney M.D.   On: 03/11/2021 17:25    Procedures Procedures   Medications Ordered in ED Medications - No data to display  ED Course  I have reviewed the triage vital signs and the nursing notes.  Pertinent labs & imaging results that were available during my care of the patient were reviewed by me and considered in my medical decision making (see chart for details).    MDM Rules/Calculators/A&P                           40 year old female presents for acute traumatic right knee pain with onset at 11 AM.  She is neurovascularly intact with x-ray suspicious for fracture of the tibial spine.  Patient placed in a knee immobilizer and given crutches.  Instructed to remain nonweightbearing and to follow-up with orthopedics; referral given.  Explained inability to exclude cruciate ligament injury, and that this would be better characterized with additional outpatient studies.  Will continue on Motrin, PRN Norco.  Return precautions discussed and provided. Patient discharged in stable condition with no unaddressed concerns.   Final Clinical Impression(s) / ED Diagnoses Final diagnoses:  Acute pain of right knee    Rx / DC Orders ED Discharge Orders          Ordered    HYDROcodone-acetaminophen (NORCO/VICODIN) 5-325 MG tablet  Every 6 hours PRN         03/11/21 1807             Antony Madura, PA-C 03/11/21 1816    Vanetta Mulders, MD 03/13/21 [phone]

## 2021-07-07 ENCOUNTER — Other Ambulatory Visit: Payer: Self-pay

## 2021-07-07 ENCOUNTER — Emergency Department (HOSPITAL_BASED_OUTPATIENT_CLINIC_OR_DEPARTMENT_OTHER)
Admission: EM | Admit: 2021-07-07 | Discharge: 2021-07-07 | Disposition: A | Discharge status: Home / Self Care | Payer: Self-pay | Attending: Emergency Medicine | Admitting: Emergency Medicine

## 2021-07-07 ENCOUNTER — Encounter (HOSPITAL_BASED_OUTPATIENT_CLINIC_OR_DEPARTMENT_OTHER): Payer: Self-pay | Admitting: Emergency Medicine

## 2021-07-07 ENCOUNTER — Other Ambulatory Visit (HOSPITAL_BASED_OUTPATIENT_CLINIC_OR_DEPARTMENT_OTHER): Payer: Self-pay

## 2021-07-07 DIAGNOSIS — B9689 Other specified bacterial agents as the cause of diseases classified elsewhere: Secondary | ICD-10-CM | POA: Insufficient documentation

## 2021-07-07 DIAGNOSIS — Z202 Contact with and (suspected) exposure to infections with a predominantly sexual mode of transmission: Secondary | ICD-10-CM | POA: Insufficient documentation

## 2021-07-07 DIAGNOSIS — N760 Acute vaginitis: Secondary | ICD-10-CM | POA: Insufficient documentation

## 2021-07-07 LAB — URINALYSIS, ROUTINE W REFLEX MICROSCOPIC
Bilirubin Urine: NEGATIVE
Glucose, UA: NEGATIVE mg/dL
Hgb urine dipstick: NEGATIVE
Ketones, ur: NEGATIVE mg/dL
Nitrite: NEGATIVE
Protein, ur: NEGATIVE mg/dL
Specific Gravity, Urine: 1.01 (ref 1.005–1.030)
pH: 6 (ref 5.0–8.0)

## 2021-07-07 LAB — WET PREP, GENITAL
Sperm: NONE SEEN
Trich, Wet Prep: NONE SEEN
WBC, Wet Prep HPF POC: <10 (ref <10)
Yeast Wet Prep HPF POC: NONE SEEN

## 2021-07-07 LAB — URINALYSIS, MICROSCOPIC (REFLEX)

## 2021-07-07 LAB — PREGNANCY, URINE: Preg Test, Ur: NEGATIVE

## 2021-07-07 MED ORDER — DOXYCYCLINE HYCLATE 100 MG PO CAPS
100.0000 mg | ORAL_CAPSULE | Freq: Two times a day (BID) | ORAL | 0 refills | Status: DC
  Filled 2021-07-07: qty 14, 7d supply, fill #0

## 2021-07-07 MED ORDER — CEFTRIAXONE SODIUM 500 MG IJ SOLR
500.0000 mg | Freq: Once | INTRAMUSCULAR | Status: AC
Start: 2021-07-07 — End: 2021-07-07
  Administered 2021-07-07: 500 mg via INTRAMUSCULAR
  Filled 2021-07-07: qty 500

## 2021-07-07 MED ORDER — METRONIDAZOLE 500 MG PO TABS
500.0000 mg | ORAL_TABLET | Freq: Two times a day (BID) | ORAL | 0 refills | Status: DC
  Filled 2021-07-07: qty 14, 7d supply, fill #0

## 2021-07-07 NOTE — ED Provider Notes (Signed)
?[facility] EMERGENCY DEPARTMENT ?Provider Note ? ? ?CSN: [id number] ?Arrival date & time: 07/07/21  1027 ? ?  ? ?History ?Chief Complaint  ?Patient presents with  ? Vaginal Discharge  ? ? ?Olivia Mcneil is a 41 y.o. female who presents to the emergency department with a 3-day history of yellow vaginal discharge.  Patient states she had intercourse with a new partner on Friday.  She does state that they use a condom.  Starting on Saturday she began having yellow discharge.  She denies any vaginal bleeding, abdominal pain, fever, chills, nausea, vomiting, diarrhea. ? ? ?Vaginal Discharge ? ?  ? ?Home Medications ?Prior to Admission medications   ?Medication Sig Start Date End Date Taking? Authorizing Provider  ?doxycycline (VIBRAMYCIN) 100 MG capsule Take 1 capsule (100 mg total) by mouth 2 (two) times daily. 07/07/21  Yes Meredeth IdeFleming, Mashal Slavick M, PA-C  ?metroNIDAZOLE (FLAGYL) 500 MG tablet Take 1 tablet (500 mg total) by mouth 2 (two) times daily. 07/07/21  Yes Meredeth IdeFleming, Malyna Budney M, PA-C  ?chlorhexidine (PERIDEX) 0.12 % solution Use as directed 15 mLs in the mouth or throat 2 (two) times daily. 06/11/19   Dartha LodgeFord, Kelsey N, PA-C  ?HYDROcodone-acetaminophen (NORCO/VICODIN) 5-325 MG tablet Take 1-2 tablets by mouth every 6 (six) hours as needed for severe pain. 03/11/21   Antony MaduraHumes, Kelly, PA-C  ?ibuprofen (ADVIL) 600 MG tablet Take 1 tablet (600 mg total) by mouth every 6 (six) hours as needed. 06/11/19   Dartha LodgeFord, Kelsey N, PA-C  ?naproxen (NAPROSYN) 500 MG tablet Take 1 tablet (500 mg total) by mouth 2 (two) times daily. 10/30/17   Petrucelli, Pleas KochSamantha R, PA-C  ?   ? ?Allergies    ?Patient has no known allergies.   ? ?Review of Systems   ?Review of Systems  ?Genitourinary:  Positive for vaginal discharge.  ?All other systems reviewed and are negative. ? ?Physical Exam ?Updated Vital Signs ?BP 130/72   Pulse 95   Temp 98.4 ?F (36.9 ?C) (Oral)   Resp 16   Ht 4\' 11"  (1.499 m)   Wt 65.8 kg   LMP 06/25/2021   SpO2 100%   BMI  29.29 kg/m?  ?Physical Exam ?Vitals and nursing note reviewed.  ?Constitutional:   ?   General: She is not in acute distress. ?   Appearance: Normal appearance.  ?HENT:  ?   Head: Normocephalic and atraumatic.  ?Eyes:  ?   General:     ?   Right eye: No discharge.     ?   Left eye: No discharge.  ?Cardiovascular:  ?   Comments: Regular rate and rhythm.  S1/S2 are distinct without any evidence of murmur, rubs, or gallops.  Radial pulses are 2+ bilaterally.  Dorsalis pedis pulses are 2+ bilaterally.  No evidence of pedal edema. ?Pulmonary:  ?   Comments: Clear to auscultation bilaterally.  Normal effort.  No respiratory distress.  No evidence of wheezes, rales, or rhonchi heard throughout. ?Abdominal:  ?   General: Abdomen is flat. Bowel sounds are normal. There is no distension.  ?   Tenderness: There is no abdominal tenderness. There is no guarding or rebound.  ?Musculoskeletal:     ?   General: Normal range of motion.  ?   Cervical back: Neck supple.  ?Skin: ?   General: Skin is warm and dry.  ?   Findings: No rash.  ?Neurological:  ?   General: No focal deficit present.  ?   Mental Status: She is alert.  ?  Psychiatric:     ?   Mood and Affect: Mood normal.     ?   Behavior: Behavior normal.  ? ? ?ED Results / Procedures / Treatments   ?Labs ?(all labs ordered are listed, but only abnormal results are displayed) ?Labs Reviewed  ?WET PREP, GENITAL - Abnormal; Notable for the following components:  ?    Result Value  ? Clue Cells Wet Prep HPF POC PRESENT (*)   ? All other components within normal limits  ?URINALYSIS, ROUTINE W REFLEX MICROSCOPIC - Abnormal; Notable for the following components:  ? Color, Urine STRAW (*)   ? APPearance HAZY (*)   ? Leukocytes,Ua MODERATE (*)   ? All other components within normal limits  ?URINALYSIS, MICROSCOPIC (REFLEX) - Abnormal; Notable for the following components:  ? Bacteria, UA RARE (*)   ? All other components within normal limits  ?PREGNANCY, URINE  ?GC/CHLAMYDIA PROBE AMP  ([place]) NOT AT [facility]  ? ? ?EKG ?None ? ?Radiology ?No results found. ? ?Procedures ?Procedures  ? ? ?Medications Ordered in ED ?Medications  ?cefTRIAXone (ROCEPHIN) injection 500 mg (500 mg Intramuscular Given 07/07/21 1229)  ? ? ?ED Course/ Medical Decision Making/ A&P ?Clinical Course as of 07/07/21 1243  ?Wed Jul 07, 2021  ?1119 Shared decision-making was done with the patient at the bedside whether to perform a pelvic exam.  Patient ultimately declined pelvic exam and would like to self swab.  I think this is an amendable plan given that her vital signs are completely normal and I have a low suspicion for PID or ovarian torsion at this moment. [CF]  ?1211 I updated patient on labs.  Patient was in agreement for treatment of possible gonorrhea, chlamydia, and bacterial vaginosis today. [CF]  ?  ?Clinical Course User Index ?[CF] Honor Loh M, PA-C  ? ?                        ?Medical Decision Making ?Amount and/or Complexity of Data Reviewed ?Labs: ordered. ? ?Risk ?Prescription drug management. ? ? ?This patient presents to the ED for concern of STD exposure, this involves an extensive number of treatment options, and is a complaint that carries with it a high risk of complications and morbidity.  The differential diagnosis includes bacterial vaginosis, gonorrhea, chlamydia, trichomonas.  Low suspicion for PID and ovarian torsion at this time.  Patient has no pain or significant abnormalities in her vital signs to suggest otherwise. ? ? ?Co morbidities that complicate the patient evaluation ? ?Past Medical History:  ?Diagnosis Date  ? Uterine polyp 2002  ? ? ?Additional history obtained: ? ?Additional history obtained from nursing note ?External records from outside source obtained and reviewed including previous wet prep 11 months ago which showed positive clue cells. Patient was also treated for gonorrhea 6 months ago.  ? ? ?Lab Tests: ? ?I Ordered, and personally interpreted labs.  The pertinent  results include: UA showed moderate leukocytes with a lot of epithelial cells.  Urine pregnancy was negative.  Wet prep did show positive clue cells.  Gonorrhea chlamydia is swabbed and pending. ? ? ?Imaging Studies ordered: ? ?None ? ? ?Cardiac Monitoring: ? ?The patient was maintained on a cardiac monitor.  I personally viewed and interpreted the cardiac monitored which showed an underlying rhythm of: Normal sinus rhythm. ? ? ?Medicines ordered and prescription drug management: ? ?I ordered medication including Ceftriaxone for STD prophylaxis ?Reevaluation of the patient after these medicines  showed that the patient stayed the same ?I have reviewed the patients home medicines and have made adjustments as needed ? ? ?Problem List / ED Course: ? ?Vaginal discharge with possible STD exposure.  Patient self swabbed and wet prep was positive for clue cells.  We will give her a round of ceftriaxone in the emergency department and discharged with doxycycline and metronidazole for STD prophylaxis and treatment for her bacterial vaginosis as she is having symptoms.  She was encouraged to return to the emergency department for any worsening symptoms.  Patient expressed full understanding.  She is safe discharge. ? ? ?Reevaluation: ? ?After the interventions noted above, I reevaluated the patient and found that they have :improved ? ? ?Social Determinants of Health: ? ?Social Determinants of Health with Concerns  ? ?Tobacco Use: High Risk  ? Smoking Tobacco Use: Every Day  ? Smokeless Tobacco Use: Never  ? Passive Exposure: Not on file  ?Financial Resource Strain: Not on file  ?Food Insecurity: Not on file  ?Transportation Needs: Not on file  ?Physical Activity: Not on file  ?Stress: Not on file  ?Social Connections: Not on file  ?Intimate Partner Violence: Not on file  ?Depression (PHQ2-9): Not on file  ?Alcohol Screen: Not on file  ?Housing: Not on file  ? ? ?Dispostion: ? ?After consideration of the diagnostic results and  the patients response to treatment, I feel that the patent would benefit from outpatient follow-up with OB/GYN. She does not meet inpatient criteria at this time.  ? ?Final Clinical Impression(s) / ED Diagnoses ?Final

## 2021-07-07 NOTE — ED Triage Notes (Signed)
Pt c/o vaginal dc since Sunday; denies irritation ?

## 2021-07-07 NOTE — Discharge Instructions (Addendum)
Please take antibiotics as prescribed.  Do not mix with alcohol.  Return to emergency department for worsening symptoms. ?

## 2021-07-08 LAB — GC/CHLAMYDIA PROBE AMP (~~LOC~~) NOT AT ARMC
Chlamydia: NEGATIVE
Comment: NEGATIVE
Comment: NORMAL
Neisseria Gonorrhea: POSITIVE — AB

## 2021-10-01 ENCOUNTER — Emergency Department (HOSPITAL_BASED_OUTPATIENT_CLINIC_OR_DEPARTMENT_OTHER)
Admission: EM | Admit: 2021-10-01 | Discharge: 2021-10-01 | Disposition: A | Discharge status: Home / Self Care | Payer: Self-pay | Attending: Emergency Medicine | Admitting: Emergency Medicine

## 2021-10-01 ENCOUNTER — Other Ambulatory Visit: Payer: Self-pay

## 2021-10-01 ENCOUNTER — Encounter (HOSPITAL_BASED_OUTPATIENT_CLINIC_OR_DEPARTMENT_OTHER): Payer: Self-pay | Admitting: Emergency Medicine

## 2021-10-01 ENCOUNTER — Emergency Department (HOSPITAL_BASED_OUTPATIENT_CLINIC_OR_DEPARTMENT_OTHER)
Admission: EM | Admit: 2021-10-01 | Discharge: 2021-10-02 | Disposition: A | Discharge status: Home / Self Care | Payer: Self-pay | Attending: Emergency Medicine | Admitting: Emergency Medicine

## 2021-10-01 DIAGNOSIS — Z91010 Allergy to peanuts: Secondary | ICD-10-CM | POA: Insufficient documentation

## 2021-10-01 DIAGNOSIS — K0889 Other specified disorders of teeth and supporting structures: Secondary | ICD-10-CM | POA: Insufficient documentation

## 2021-10-01 DIAGNOSIS — K047 Periapical abscess without sinus: Secondary | ICD-10-CM | POA: Insufficient documentation

## 2021-10-01 MED ORDER — PENICILLIN V POTASSIUM 500 MG PO TABS: 500.0000 mg | ORAL_TABLET | Freq: Four times a day (QID) | ORAL | 0 refills | Status: AC

## 2021-10-01 MED ORDER — OXYCODONE-ACETAMINOPHEN 5-325 MG PO TABS
1.0000 | ORAL_TABLET | Freq: Once | ORAL | Status: AC
  Administered 2021-10-01: 1 via ORAL
  Filled 2021-10-01: qty 1

## 2021-10-01 MED ORDER — CHLORHEXIDINE GLUCONATE 0.12 % MT SOLN: 15.0000 mL | Freq: Two times a day (BID) | OROMUCOSAL | 0 refills | Status: AC

## 2021-10-01 NOTE — ED Notes (Signed)
Patient provided with warm blanket for comfort ?

## 2021-10-01 NOTE — ED Triage Notes (Signed)
Patient coming to ED for evaluation of dental pain and swelling to top of mouth.  Reports she was seen here last night.  Started antibiotics and "mouth wash."  States unable to control pain at home.  No fevers.  Has started antibiotics and taking as prescribed

## 2021-10-02 MED ORDER — LIDOCAINE HCL (PF) 1 % IJ SOLN
30.0000 mL | Freq: Once | INTRAMUSCULAR | Status: AC
Start: 2021-10-02 — End: 2021-10-02
  Administered 2021-10-02: 30 mL
  Filled 2021-10-02: qty 30

## 2021-10-02 MED ORDER — OXYCODONE HCL 5 MG PO TABS: 5.0000 mg | ORAL_TABLET | Freq: Four times a day (QID) | ORAL | 0 refills | Status: AC | PRN

## 2021-10-02 MED ORDER — BENZOCAINE 20 % MT AERO
INHALATION_SPRAY | Freq: Once | OROMUCOSAL | Status: AC
Start: 2021-10-02 — End: 2021-10-02
  Administered 2021-10-02: 1 via OROMUCOSAL
  Filled 2021-10-02: qty 57

## 2021-10-02 MED ORDER — HYDROMORPHONE HCL 1 MG/ML IJ SOLN
1.0000 mg | Freq: Once | INTRAMUSCULAR | Status: AC
  Administered 2021-10-02: 1 mg via INTRAMUSCULAR
  Filled 2021-10-02: qty 1

## 2021-10-02 MED ORDER — ONDANSETRON 4 MG PO TBDP
8.0000 mg | ORAL_TABLET | Freq: Once | ORAL | Status: AC
Start: 2021-10-02 — End: 2021-10-02
  Administered 2021-10-02: 8 mg via ORAL
  Filled 2021-10-02: qty 2

## 2021-10-02 MED ORDER — HYDROCODONE-ACETAMINOPHEN 5-325 MG PO TABS
2.0000 | ORAL_TABLET | Freq: Once | ORAL | Status: AC
  Administered 2021-10-02: 2 via ORAL
  Filled 2021-10-02: qty 2

## 2022-04-24 IMAGING — DX DG KNEE COMPLETE 4+V*R*
4 series · 4 of 4 positions shown · non-contrast
Comparison: None.

CLINICAL DATA: Fall.

EXAM:
RIGHT KNEE - COMPLETE 4+ VIEW

[knee ap]
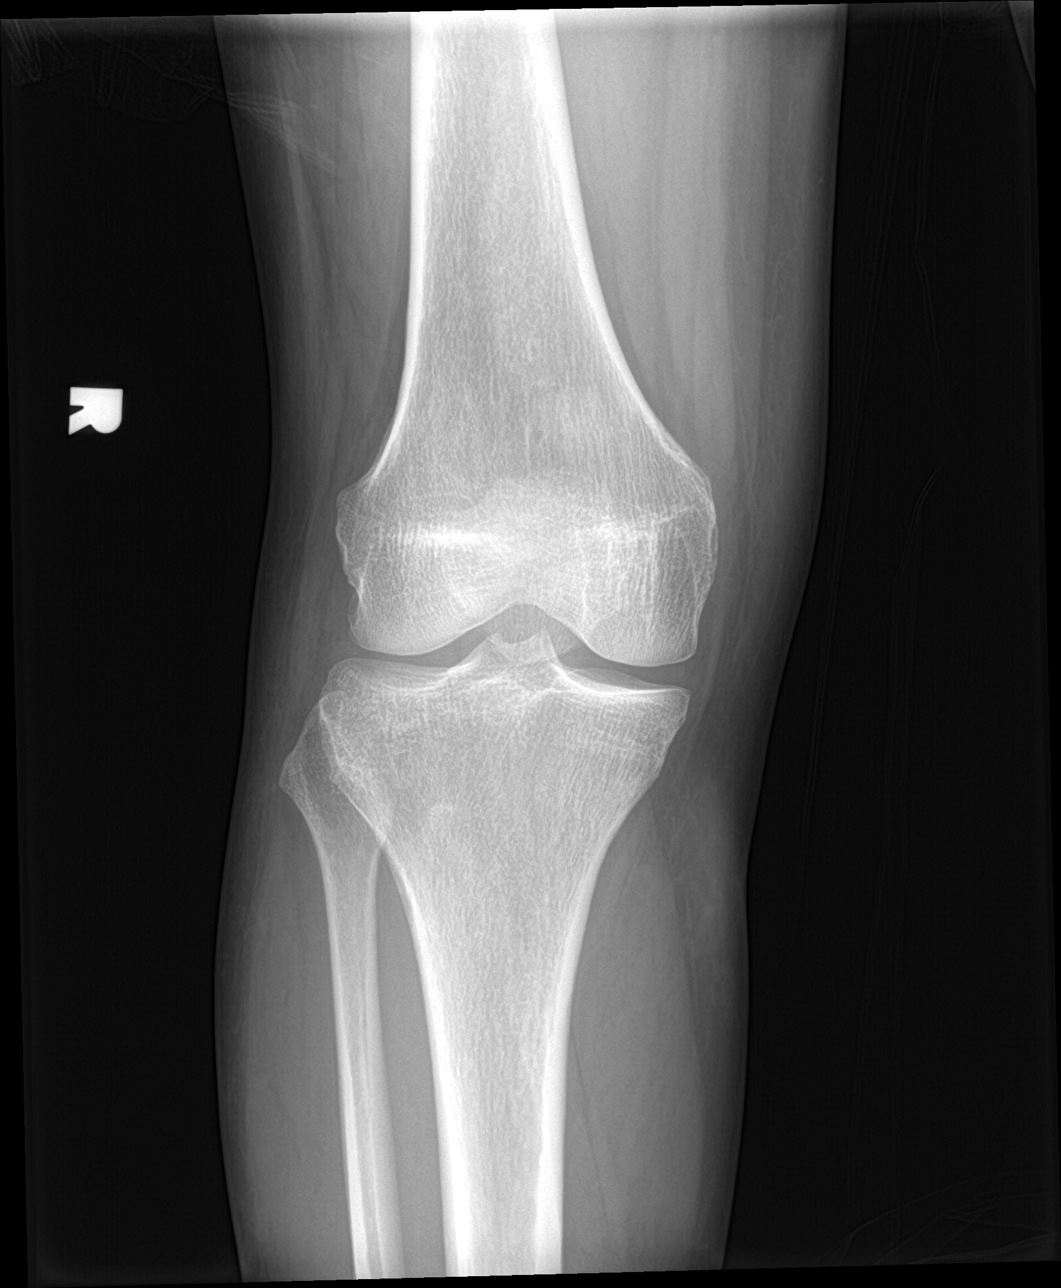

[knee lat]
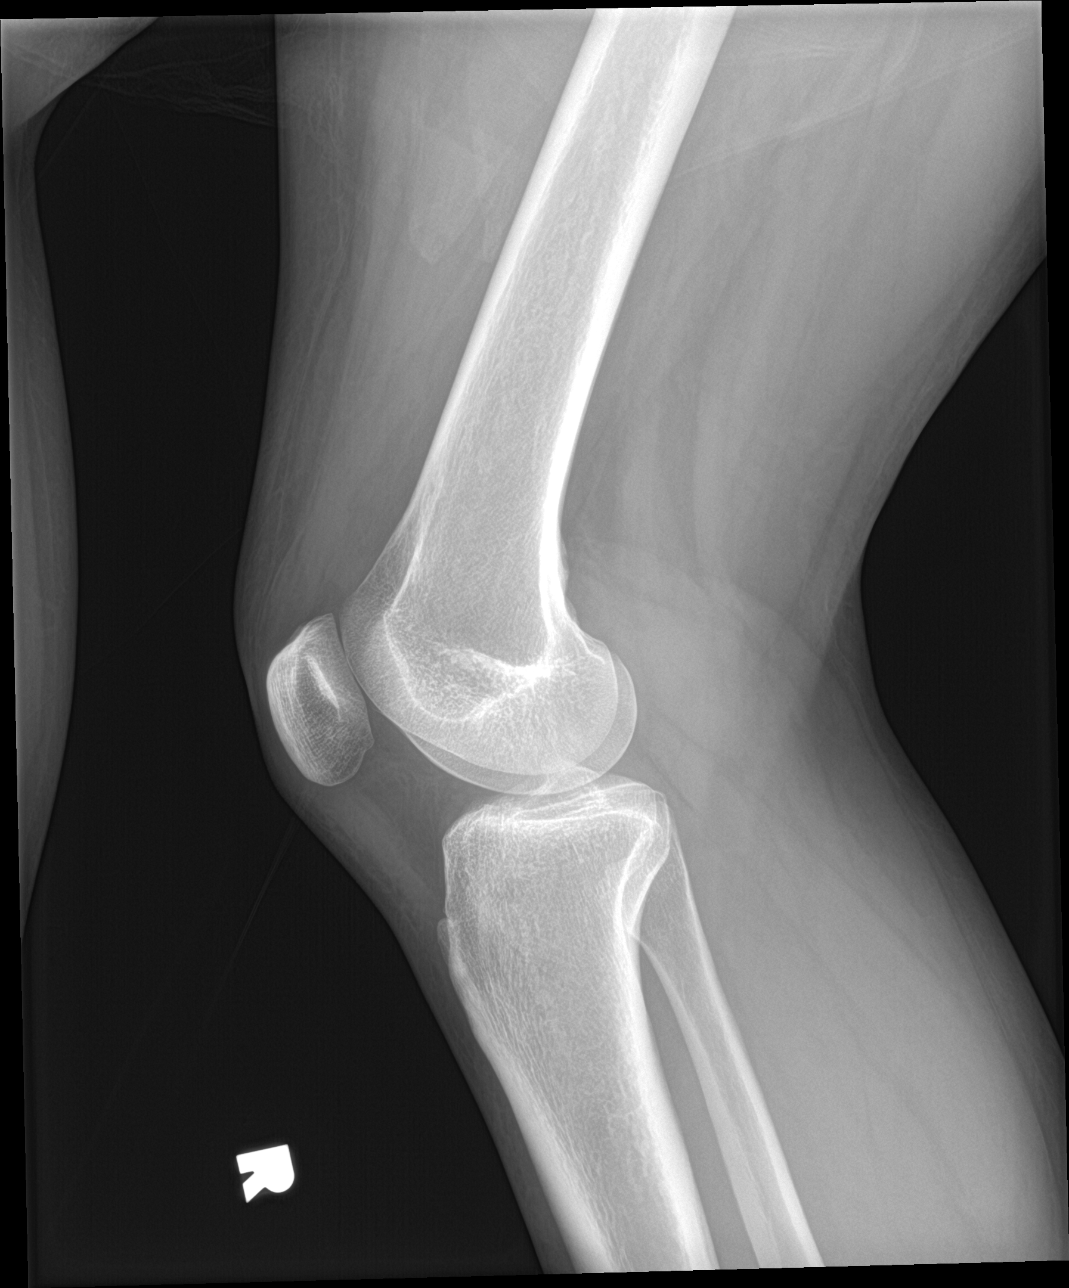

[knee obl (1 of 2)]
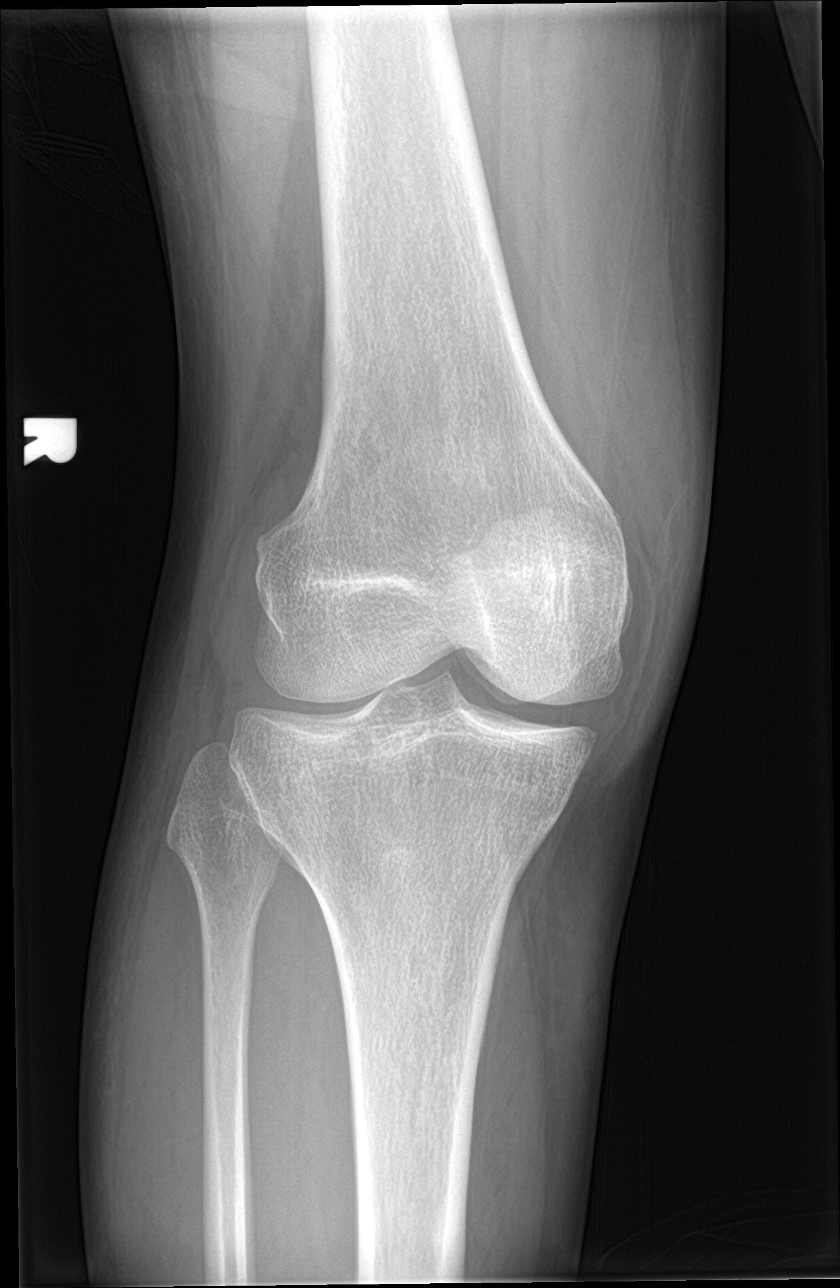

[knee obl (2 of 2)]
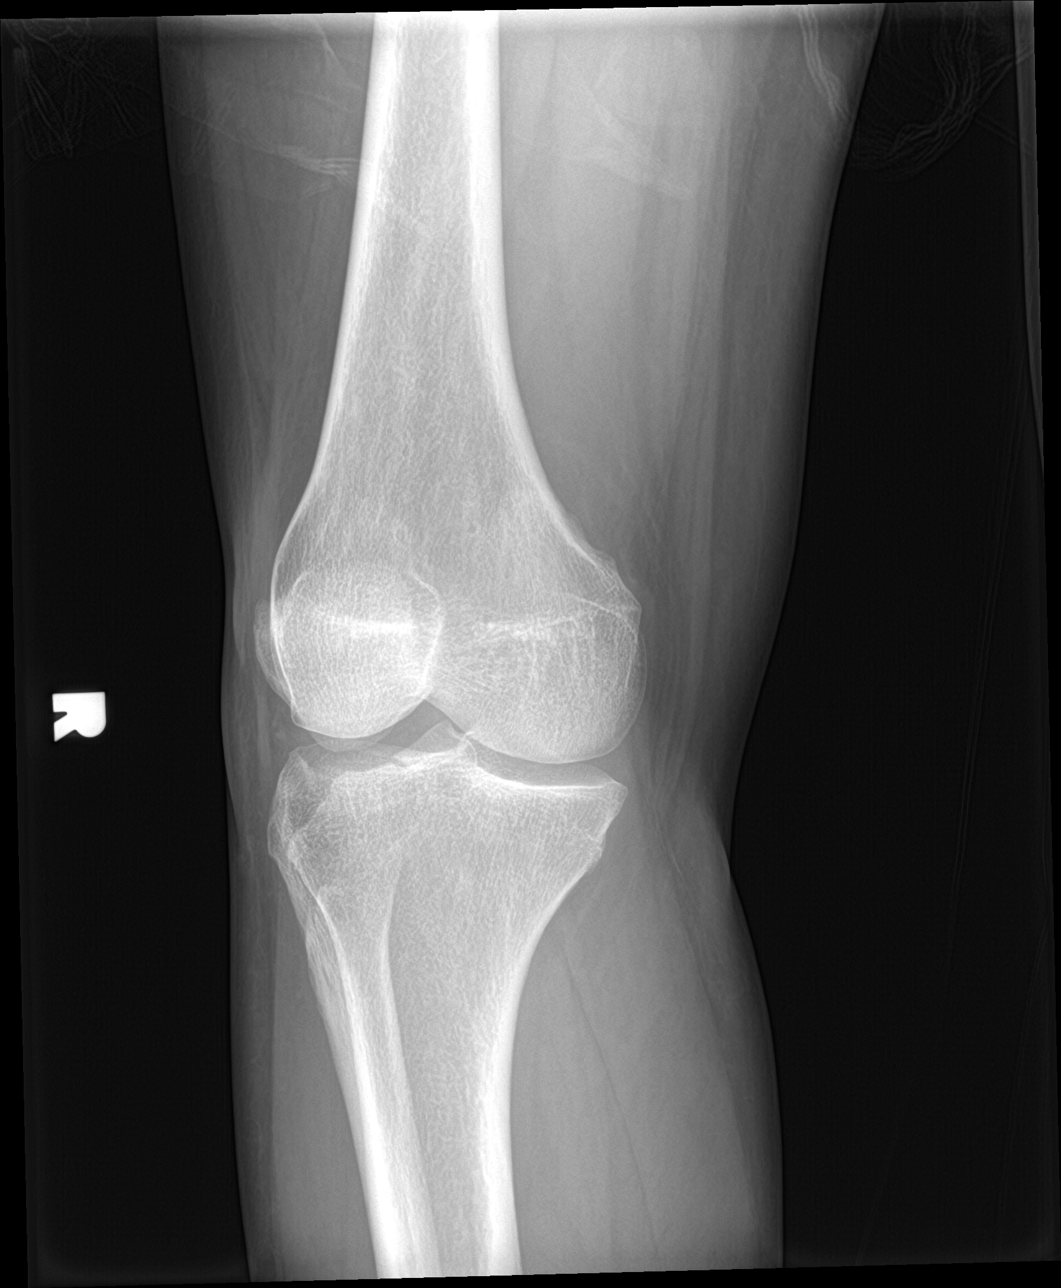

[4 of 4 positions shown; findings below may reference images not displayed]

FINDINGS: Joint effusion is present. On the oblique view there is a small
linear lucency through the base of the tibial spines. The joint
spaces are well maintained and alignment is anatomic.
IMPRESSION: 1. Small linear lucency through the base of the tibial spines.
Findings may be related to artifact/normal overlapping structures.
Small acute nondisplaced fracture is not excluded.
2. Joint effusion.
